# Patient Record
Sex: Male | Born: 1975 | Race: White | Hispanic: No | Marital: Single | Smoking: Current every day smoker
Health system: Southern US, Community
[De-identification: ages and names within clinical notes are randomized; demographics above are authoritative.]

## PROBLEM LIST (undated history)

## (undated) DIAGNOSIS — K219 Gastro-esophageal reflux disease without esophagitis: Secondary | ICD-10-CM

---

## 2010-02-11 ENCOUNTER — Emergency Department: Payer: Self-pay | Admitting: Emergency Medicine

## 2010-02-12 ENCOUNTER — Emergency Department: Payer: Self-pay | Admitting: Emergency Medicine

## 2010-08-16 ENCOUNTER — Emergency Department: Payer: Self-pay | Admitting: Unknown Physician Specialty

## 2011-06-29 ENCOUNTER — Emergency Department: Payer: Self-pay | Admitting: Emergency Medicine

## 2011-10-08 ENCOUNTER — Emergency Department: Payer: Self-pay | Admitting: Emergency Medicine

## 2011-10-14 ENCOUNTER — Emergency Department: Payer: Self-pay | Admitting: Internal Medicine

## 2011-11-19 ENCOUNTER — Emergency Department: Payer: Self-pay | Admitting: Internal Medicine

## 2011-11-23 ENCOUNTER — Emergency Department: Payer: Self-pay | Admitting: Emergency Medicine

## 2011-11-25 ENCOUNTER — Emergency Department: Payer: Self-pay | Admitting: Emergency Medicine

## 2012-07-22 ENCOUNTER — Emergency Department: Payer: Self-pay | Admitting: Unknown Physician Specialty

## 2012-07-22 LAB — CBC
HCT: 45.4 % (ref 40.0–52.0)
HGB: 15.7 g/dL (ref 13.0–18.0)
MCHC: 34.6 g/dL (ref 32.0–36.0)
MCV: 96 fL (ref 80–100)
RDW: 12.9 % (ref 11.5–14.5)

## 2012-07-22 LAB — COMPREHENSIVE METABOLIC PANEL
Albumin: 4.1 g/dL (ref 3.4–5.0)
Alkaline Phosphatase: 100 U/L (ref 50–136)
Anion Gap: 10 (ref 7–16)
BUN: 9 mg/dL (ref 7–18)
Calcium, Total: 9.2 mg/dL (ref 8.5–10.1)
Co2: 26 mmol/L (ref 21–32)
Glucose: 133 mg/dL — ABNORMAL HIGH (ref 65–99)
Osmolality: 278 (ref 275–301)
Potassium: 3.6 mmol/L (ref 3.5–5.1)
SGOT(AST): 21 U/L (ref 15–37)
SGPT (ALT): 28 U/L (ref 12–78)
Sodium: 139 mmol/L (ref 136–145)
Total Protein: 7.9 g/dL (ref 6.4–8.2)

## 2012-07-22 LAB — URINALYSIS, COMPLETE
Bacteria: NONE SEEN
Bilirubin,UR: NEGATIVE
Blood: NEGATIVE
Glucose,UR: NEGATIVE mg/dL (ref 0–75)
Leukocyte Esterase: NEGATIVE
Specific Gravity: 1.031 (ref 1.003–1.030)
Squamous Epithelial: <1
WBC UR: 1 /HPF (ref 0–5)

## 2012-07-22 LAB — DRUG SCREEN, URINE
Amphetamines, Ur Screen: NEGATIVE (ref ?–1000)
Benzodiazepine, Ur Scrn: NEGATIVE (ref ?–200)
Cocaine Metabolite,Ur ~~LOC~~: POSITIVE (ref ?–300)
Methadone, Ur Screen: NEGATIVE (ref ?–300)
Tricyclic, Ur Screen: NEGATIVE (ref ?–1000)

## 2015-01-17 ENCOUNTER — Emergency Department: Payer: Self-pay

## 2015-01-17 ENCOUNTER — Other Ambulatory Visit: Payer: Self-pay

## 2015-01-17 ENCOUNTER — Emergency Department
Admission: EM | Admit: 2015-01-17 | Discharge: 2015-01-17 | Disposition: A | Discharge status: Home / Self Care | Payer: Self-pay | Attending: Emergency Medicine | Admitting: Emergency Medicine

## 2015-01-17 DIAGNOSIS — Z720 Tobacco use: Secondary | ICD-10-CM | POA: Insufficient documentation

## 2015-01-17 DIAGNOSIS — R1013 Epigastric pain: Secondary | ICD-10-CM | POA: Insufficient documentation

## 2015-01-17 DIAGNOSIS — R112 Nausea with vomiting, unspecified: Secondary | ICD-10-CM

## 2015-01-17 LAB — CBC WITH DIFFERENTIAL/PLATELET
BASOS ABS: 0 10*3/uL (ref 0–0.1)
BASOS PCT: 0 %
EOS PCT: 0 %
Eosinophils Absolute: 0 10*3/uL (ref 0–0.7)
HCT: 45 % (ref 40.0–52.0)
Hemoglobin: 15.5 g/dL (ref 13.0–18.0)
Lymphocytes Relative: 13 %
Lymphs Abs: 1.6 10*3/uL (ref 1.0–3.6)
MCH: 31.9 pg (ref 26.0–34.0)
MCHC: 34.5 g/dL (ref 32.0–36.0)
MCV: 92.7 fL (ref 80.0–100.0)
MONOS PCT: 9 %
Monocytes Absolute: 1.1 10*3/uL — ABNORMAL HIGH (ref 0.2–1.0)
NEUTROS ABS: 10.2 10*3/uL — AB (ref 1.4–6.5)
Neutrophils Relative %: 78 %
Platelets: 280 10*3/uL (ref 150–440)
RBC: 4.85 MIL/uL (ref 4.40–5.90)
RDW: 13.2 % (ref 11.5–14.5)
WBC: 13 10*3/uL — ABNORMAL HIGH (ref 3.8–10.6)

## 2015-01-17 LAB — URINALYSIS COMPLETE WITH MICROSCOPIC (ARMC ONLY)
BILIRUBIN URINE: NEGATIVE
GLUCOSE, UA: NEGATIVE mg/dL
Hgb urine dipstick: NEGATIVE
Leukocytes, UA: NEGATIVE
Nitrite: NEGATIVE
Protein, ur: 30 mg/dL — AB
SQUAMOUS EPITHELIAL / LPF: NONE SEEN
Specific Gravity, Urine: >1.06 — ABNORMAL HIGH (ref 1.005–1.030)
pH: 6 (ref 5.0–8.0)

## 2015-01-17 LAB — COMPREHENSIVE METABOLIC PANEL
ALT: 28 U/L (ref 17–63)
AST: 27 U/L (ref 15–41)
Albumin: 5.1 g/dL — ABNORMAL HIGH (ref 3.5–5.0)
Alkaline Phosphatase: 72 U/L (ref 38–126)
Anion gap: 13 (ref 5–15)
BUN: 15 mg/dL (ref 6–20)
CO2: 28 mmol/L (ref 22–32)
Calcium: 9.9 mg/dL (ref 8.9–10.3)
Chloride: 100 mmol/L — ABNORMAL LOW (ref 101–111)
Creatinine, Ser: 1.08 mg/dL (ref 0.61–1.24)
GFR calc Af Amer: >60 mL/min (ref >60)
Glucose, Bld: 152 mg/dL — ABNORMAL HIGH (ref 65–99)
Potassium: 3.6 mmol/L (ref 3.5–5.1)
SODIUM: 141 mmol/L (ref 135–145)
TOTAL PROTEIN: 8.3 g/dL — AB (ref 6.5–8.1)
Total Bilirubin: 0.9 mg/dL (ref 0.3–1.2)

## 2015-01-17 LAB — TROPONIN I: Troponin I: <0.03 ng/mL (ref <0.031)

## 2015-01-17 LAB — LIPASE, BLOOD: Lipase: 21 U/L — ABNORMAL LOW (ref 22–51)

## 2015-01-17 MED ORDER — ALUM & MAG HYDROXIDE-SIMETH 200-200-20 MG/5ML PO SUSP
30.0000 mL | ORAL | Status: AC
  Administered 2015-01-17: 30 mL via ORAL

## 2015-01-17 MED ORDER — HYDROMORPHONE HCL 1 MG/ML IJ SOLN
1.0000 mg | INTRAMUSCULAR | Status: AC
  Administered 2015-01-17: 1 mg via INTRAVENOUS

## 2015-01-17 MED ORDER — SODIUM CHLORIDE 0.9 % IV BOLUS (SEPSIS)
1000.0000 mL | Freq: Once | INTRAVENOUS | Status: AC
  Administered 2015-01-17: 1000 mL via INTRAVENOUS

## 2015-01-17 MED ORDER — MORPHINE SULFATE 4 MG/ML IJ SOLN
INTRAMUSCULAR | Status: AC
  Filled 2015-01-17: qty 1

## 2015-01-17 MED ORDER — KETOROLAC TROMETHAMINE 30 MG/ML IJ SOLN
30.0000 mg | Freq: Once | INTRAMUSCULAR | Status: AC
  Administered 2015-01-17: 30 mg via INTRAVENOUS

## 2015-01-17 MED ORDER — ONDANSETRON HCL 4 MG/2ML IJ SOLN
4.0000 mg | Freq: Once | INTRAMUSCULAR | Status: AC
  Administered 2015-01-17: 4 mg via INTRAVENOUS

## 2015-01-17 MED ORDER — HYDROMORPHONE HCL 1 MG/ML IJ SOLN
INTRAMUSCULAR | Status: AC
  Filled 2015-01-17: qty 1

## 2015-01-17 MED ORDER — ONDANSETRON HCL 4 MG/2ML IJ SOLN
INTRAMUSCULAR | Status: DC
Start: 2015-01-17 — End: 2015-01-17
  Filled 2015-01-17: qty 2

## 2015-01-17 MED ORDER — PROMETHAZINE HCL 25 MG/ML IJ SOLN
25.0000 mg | Freq: Once | INTRAMUSCULAR | Status: AC
  Administered 2015-01-17: 25 mg via INTRAVENOUS

## 2015-01-17 MED ORDER — SODIUM CHLORIDE 0.9 % IV BOLUS (SEPSIS): 1000.0000 mL | INTRAVENOUS | Status: DC

## 2015-01-17 MED ORDER — PROMETHAZINE HCL 50 MG PO TABS
25.0000 mg | ORAL_TABLET | Freq: Four times a day (QID) | ORAL | Status: DC | PRN
Start: 2015-01-17 — End: 2015-02-27

## 2015-01-17 MED ORDER — KETOROLAC TROMETHAMINE 30 MG/ML IJ SOLN
INTRAMUSCULAR | Status: AC
  Filled 2015-01-17: qty 1

## 2015-01-17 MED ORDER — PROMETHAZINE HCL 25 MG/ML IJ SOLN
INTRAMUSCULAR | Status: AC
  Filled 2015-01-17: qty 1

## 2015-01-17 MED ORDER — ALUM & MAG HYDROXIDE-SIMETH 200-200-20 MG/5ML PO SUSP
ORAL | Status: AC
  Filled 2015-01-17: qty 30

## 2015-01-17 MED ORDER — IOHEXOL 300 MG/ML  SOLN
100.0000 mL | Freq: Once | INTRAMUSCULAR | Status: AC | PRN
Start: 2015-01-17 — End: 2015-01-17
  Administered 2015-01-17: 100 mL via INTRAVENOUS

## 2015-01-17 MED ORDER — MORPHINE SULFATE 4 MG/ML IJ SOLN
4.0000 mg | Freq: Once | INTRAMUSCULAR | Status: AC
Start: 2015-01-17 — End: 2015-01-17
  Administered 2015-01-17: 4 mg via INTRAVENOUS

## 2015-01-17 NOTE — Discharge Instructions (Signed)
Nausea and Vomiting You have been seen in the Emergency Department (ED) for abdominal pain.  Your evaluation did not identify a clear cause of your symptoms but was generally reassuring.  Please follow up as instructed above regarding todays emergent visit and the symptoms that are bothering you.  Return to the ED if your abdominal pain worsens or fails to improve, you develop bloody vomiting, bloody diarrhea, you are unable to tolerate fluids due to vomiting, fever greater than 101, or other symptoms that concern you.   Nausea is a sick feeling that often comes before throwing up (vomiting). Vomiting is a reflex where stomach contents come out of your mouth. Vomiting can cause severe loss of body fluids (dehydration). Children and elderly adults can become dehydrated quickly, especially if they also have diarrhea. Nausea and vomiting are symptoms of a condition or disease. It is important to find the cause of your symptoms. CAUSES   Direct irritation of the stomach lining. This irritation can result from increased acid production (gastroesophageal reflux disease), infection, food poisoning, taking certain medicines (such as nonsteroidal anti-inflammatory drugs), alcohol use, or tobacco use.  Signals from the brain.These signals could be caused by a headache, heat exposure, an inner ear disturbance, increased pressure in the brain from injury, infection, a tumor, or a concussion, pain, emotional stimulus, or metabolic problems.  An obstruction in the gastrointestinal tract (bowel obstruction).  Illnesses such as diabetes, hepatitis, gallbladder problems, appendicitis, kidney problems, cancer, sepsis, atypical symptoms of a heart attack, or eating disorders.  Medical treatments such as chemotherapy and radiation.  Receiving medicine that makes you sleep (general anesthetic) during surgery. DIAGNOSIS Your caregiver may ask for tests to be done if the problems do not improve after a few days.  Tests may also be done if symptoms are severe or if the reason for the nausea and vomiting is not clear. Tests may include:  Urine tests.  Blood tests.  Stool tests.  Cultures (to look for evidence of infection).  X-rays or other imaging studies. Test results can help your caregiver make decisions about treatment or the need for additional tests. TREATMENT You need to stay well hydrated. Drink frequently but in small amounts.You may wish to drink water, sports drinks, clear broth, or eat frozen ice pops or gelatin dessert to help stay hydrated.When you eat, eating slowly may help prevent nausea.There are also some antinausea medicines that may help prevent nausea. HOME CARE INSTRUCTIONS   Take all medicine as directed by your caregiver.  If you do not have an appetite, do not force yourself to eat. However, you must continue to drink fluids.  If you have an appetite, eat a normal diet unless your caregiver tells you differently.  Eat a variety of complex carbohydrates (rice, wheat, potatoes, bread), lean meats, yogurt, fruits, and vegetables.  Avoid high-fat foods because they are more difficult to digest.  Drink enough water and fluids to keep your urine clear or pale yellow.  If you are dehydrated, ask your caregiver for specific rehydration instructions. Signs of dehydration may include:  Severe thirst.  Dry lips and mouth.  Dizziness.  Dark urine.  Decreasing urine frequency and amount.  Confusion.  Rapid breathing or pulse. SEEK IMMEDIATE MEDICAL CARE IF:   You have blood or brown flecks (like coffee grounds) in your vomit.  You have black or bloody stools.  You have a severe headache or stiff neck.  You are confused.  You have severe abdominal pain.  You have chest  pain or trouble breathing.  You do not urinate at least once every 8 hours.  You develop cold or clammy skin.  You continue to vomit for longer than 24 to 48 hours.  You have a  fever. MAKE SURE YOU:   Understand these instructions.  Will watch your condition.  Will get help right away if you are not doing well or get worse. Document Released: 08/30/2005 Document Revised: 11/22/2011 Document Reviewed: 01/27/2011 [facility] Patient Information 2015 [employer], [place]. This information is not intended to replace advice given to you by your health care provider. Make sure you discuss any questions you have with your health care provider.

## 2015-01-17 NOTE — ED Notes (Signed)
Pt informed to return if any life threatening symptoms occur.  

## 2015-01-17 NOTE — ED Notes (Signed)
Pt presents to ED with c/o severe abdominal pain x2-3 days, with forceful; N/V. Denies shortness of breath, no chest pain. Pt reports "just throwing up and stomach pain". Pt is A&O, in moderate distress, actively writhing and calling out in pain.

## 2015-01-17 NOTE — ED Provider Notes (Addendum)
[facility] Emergency Department Provider Note    ____________________________________________  Time seen: 4:25 AM  I have reviewed the triage vital signs and the nursing notes.   HISTORY  Chief Complaint Abdominal Pain   HPI Blake Kerr is a 39 y.o. male presents with severe abdominal pain that started approximately 2 days ago. Patient reports that he's been having upset stomach for approximately the last 24 hours, got a little better before he went to bed at 9:00, then at 2 AM awoke with severe worsening of his upper abdominal pain. He says it hurts around his belly button. This is associated with vomiting, nausea, and retching.  Location is upper abdominal and around bellybutton. Duration 2 days. Timing gradual onset, with sudden worsening tonight. Severity is rated severe and 10 out of 10. Quality is sharp and painful. No recent surgeries noted medications no changes in activities. No heavy drinking. Nothing makes it better.  Patient denies diarrhea, denies bloody vomitus, denies fevers chills. Patient denies any chest pain or shortness of breath.  Patient states he has no medical history, no medical problems, he does use alcohol approximately 24 ounces per day, but is not a alcohol the last 3 days.  Patient denies medical history  Patient denies any allergies to any medications  Patient is a smoker  Patient does use alcohol occasionally, none the last 3 days.  History reviewed. No pertinent past medical history.  There are no active problems to display for this patient.   History reviewed. No pertinent past surgical history.  No current outpatient prescriptions on file.  Allergies Review of patient's allergies indicates no known allergies.  History reviewed. No pertinent family history.  Social History History  Substance Use Topics  . Smoking status: Current Every Day Smoker -- 0.75 packs/day  . Smokeless tobacco: Not on file  .  Alcohol Use: 8.4 oz/week    14 Cans of beer per week    Review of Systems  Constitutional: Negative for fever. Eyes: Negative for visual changes. ENT: Negative for sore throat. Cardiovascular: Negative for chest pain. Respiratory: Negative for shortness of breath. Gastrointestinal: See history of present illness Genitourinary: Negative for dysuria. Musculoskeletal: Negative for back pain. Skin: Negative for rash. Neurological: Negative for headaches, focal weakness or numbness. No testicular pain no groin pain.  10-point ROS otherwise negative.  ____________________________________________   PHYSICAL EXAM:  VITAL SIGNS: ED Triage Vitals  Enc Vitals Group     BP --      Pulse --      Resp --      Temp --      Temp src --      SpO2 01/17/15 0426 100 %     Weight --      Height --      Head Cir --      Peak Flow --      Pain Score --      Pain Loc --      Pain Edu? --      Excl. in GC? --      Constitutional: Alert and oriented. Patient initially laying on his stomach, dry heaving. Appears very uncomfortable patient and is fully awake and severe pain apparent. Eyes: Conjunctivae are normal. PERRL. Normal extraocular movements. ENT   Head: Normocephalic and atraumatic.   Nose: No congestion/rhinnorhea.   Mouth/Throat: Mucous membranes are moist.   Neck: No stridor. Hematological/Lymphatic/Immunilogical: No cervical lymphadenopathy. Cardiovascular: Normal rate, regular rhythm. Normal and symmetric distal pulses  are present in all extremities. No murmurs, rubs, or gallops. Respiratory: Normal respiratory effort without tachypnea nor retractions. Breath sounds are clear and equal bilaterally. No wheezes/rales/rhonchi. Gastrointestinal: Patient with significant tenderness in the epigastrium and periumbilical region. There is no distention. There is no rebound or guarding noted. Genitourinary: Normal testicles and penis, and no groin mass or  lesion. Musculoskeletal: Nontender with normal range of motion in all extremities. No joint effusions.  No lower extremity tenderness nor edema. Neurologic:  Normal speech and language. No gross focal neurologic deficits are appreciated. Speech is normal. No gait instability. Skin:  Skin is warm, dry and intact. No rash noted. Psychiatric: Mood and affect are normal. Speech and behavior are normal. Patient exhibits appropriate insight and judgment.  ____________________________________________    LABS (pertinent positives/negatives)  Results for orders placed or performed during the hospital encounter of 01/17/15  CBC WITH DIFFERENTIAL  Result Value Ref Range   WBC 13.0 (H) 3.8 - 10.6 K/uL   RBC 4.85 4.40 - 5.90 MIL/uL   Hemoglobin 15.5 13.0 - 18.0 g/dL   HCT 16.145.0 09.640.0 - 04.552.0 %   MCV 92.7 80.0 - 100.0 fL   MCH 31.9 26.0 - 34.0 pg   MCHC 34.5 32.0 - 36.0 g/dL   RDW 40.913.2 81.111.5 - 91.414.5 %   Platelets 280 150 - 440 K/uL   Neutrophils Relative % 78 %   Neutro Abs 10.2 (H) 1.4 - 6.5 K/uL   Lymphocytes Relative 13 %   Lymphs Abs 1.6 1.0 - 3.6 K/uL   Monocytes Relative 9 %   Monocytes Absolute 1.1 (H) 0.2 - 1.0 K/uL   Eosinophils Relative 0 %   Eosinophils Absolute 0.0 0 - 0.7 K/uL   Basophils Relative 0 %   Basophils Absolute 0.0 0 - 0.1 K/uL  Comprehensive metabolic panel  Result Value Ref Range   Sodium 141 135 - 145 mmol/L   Potassium 3.6 3.5 - 5.1 mmol/L   Chloride 100 (L) 101 - 111 mmol/L   CO2 28 22 - 32 mmol/L   Glucose, Bld 152 (H) 65 - 99 mg/dL   BUN 15 6 - 20 mg/dL   Creatinine, Ser 7.821.08 0.61 - 1.24 mg/dL   Calcium 9.9 8.9 - 95.610.3 mg/dL   Total Protein 8.3 (H) 6.5 - 8.1 g/dL   Albumin 5.1 (H) 3.5 - 5.0 g/dL   AST 27 15 - 41 U/L   ALT 28 17 - 63 U/L   Alkaline Phosphatase 72 38 - 126 U/L   Total Bilirubin 0.9 0.3 - 1.2 mg/dL   GFR calc non Af Amer >60 >60 mL/min   GFR calc Af Amer >60 >60 mL/min   Anion gap 13 5 - 15  Lipase, blood  Result Value Ref Range   Lipase  21 (L) 22 - 51 U/L  Troponin I  (only if pt is 37 y.o. or older and pain is above umbilicus)  Result Value Ref Range   Troponin I <0.03 <0.031 ng/mL     ____________________________________________   EKG  Normal sinus rhythm with sinus arrhythmia rate 82 bpm right axis with incomplete right bundle-branch block. There are no acute ischemic ST changes. QRS duration 96 QTc calculated at 444.  ____________________________________________    RADIOLOGY  CT abdomen and pelvis: No acute  ____________________________________________   PROCEDURES  Procedure(s) performed: None  Critical Care performed: No  ____________________________________________   INITIAL IMPRESSION / ASSESSMENT AND PLAN / ED COURSE  Pertinent labs & imaging results that were  available during my care of the patient were reviewed by me and considered in my medical decision making (see chart for details).  Patient with no significant past medical history presents with approximately 2 days of abdominal pain nausea, with severe worsening this evening. Patient has severe epigastric pain. Patient appears very uncomfortable.  Differential diagnosis includes perforated ulcer, acute pancreatitis, cholecystitis, choledocholithiasis, acute hepatitis, and other abdominal pathologies.  Patient denies any cardiopulmonary symptoms.  At this point will send a abdominal lab panel, obtain an upright chest x-ray to evaluate for any free air, obtain CT imaging of the abdomen and pelvis given the severity of symptoms.  Pain control with morphine, Toradol, Zofran, hydrate.    ----------------------------------------- 5:57 AM on 01/17/2015 -----------------------------------------  Reevaluated patient. He is currently resting much more comfortably, his nausea is improving. He still reports that he is having moderate mid abdominal pain at this time. His parents are now present, and I have updated them on plan of care. Pending  CT abdomen and pelvis at this time.   ----------------------------------------- 6:49 AM on 01/17/2015 -----------------------------------------  Patient reports he feels much improved now. His CT scan has been performed and is notably normal at this time. Patient reports his abdominal pain and nausea are now much improved. He is resting comfortably. He is able tolerate by mouth contrast.  Review of labs notable for a slight leukocytosis, otherwise normal LFTs and lipase. His ECG shows no ischemic changes. His clinical course appears much improved at this time.  Given the amount of nausea and vomiting had we will continue with an additional 1 L of normal saline.  Discussed with the patient return precautions specifically for abdominal pain.  Plan of care is to administer additional 1 L of normal saline, we'll have by Dr. York CeriseForbach partner follow-up on patient's UA and reeval prior to discharge to assure he remains improved.  Patient does not have a primary care physician, but I did notify him he can follow-up at the current total acute clinic or the emergency department in 1-2 days for repeat evaluation.  Turned precautions specifically in including fever, recurrence of severe pain, vomiting and inability to hold by mouth down, weakness, or other concerns discussed with the patient.  Patient continues to feel well at this time. Discussed with him that we will give him an additional liter of fluid because of the severity of his symptoms, he is quite agreeable to this. Patient agrees not to drive home. He arrived by EMS, and his parents will be taking him home.    FINAL CLINICAL IMPRESSION(S) / ED DIAGNOSES  Final diagnoses:  Non-intractable vomiting with nausea, vomiting of unspecified type     Sharyn CreamerMark Annye Forrey, MD 01/17/15 [id number]  Sharyn CreamerMark Kaushik Maul, MD 01/17/15 [phone]

## 2015-01-17 NOTE — ED Provider Notes (Signed)
-----------------------------------------   9:37 AM on 01/17/2015 -----------------------------------------  The patient has completed another liter of fluids. He is in no acute distress and his pain is under control. His urinalysis was negative. He will be discharged per Dr. Lorenza ChickQuale's plan.  Loleta Roseory Jarell Mcewen, MD 01/17/15 [phone]

## 2015-01-18 DIAGNOSIS — F1010 Alcohol abuse, uncomplicated: Secondary | ICD-10-CM | POA: Insufficient documentation

## 2015-01-18 DIAGNOSIS — F1210 Cannabis abuse, uncomplicated: Secondary | ICD-10-CM | POA: Insufficient documentation

## 2015-01-18 DIAGNOSIS — R109 Unspecified abdominal pain: Secondary | ICD-10-CM | POA: Insufficient documentation

## 2015-02-27 ENCOUNTER — Emergency Department
Admission: EM | Admit: 2015-02-27 | Discharge: 2015-02-27 | Disposition: A | Discharge status: Home / Self Care | Payer: Self-pay | Attending: Emergency Medicine | Admitting: Emergency Medicine

## 2015-02-27 ENCOUNTER — Emergency Department: Payer: Self-pay

## 2015-02-27 ENCOUNTER — Encounter: Payer: Self-pay | Admitting: Emergency Medicine

## 2015-02-27 DIAGNOSIS — F419 Anxiety disorder, unspecified: Secondary | ICD-10-CM | POA: Insufficient documentation

## 2015-02-27 DIAGNOSIS — R109 Unspecified abdominal pain: Secondary | ICD-10-CM

## 2015-02-27 DIAGNOSIS — K2970 Gastritis, unspecified, without bleeding: Secondary | ICD-10-CM | POA: Insufficient documentation

## 2015-02-27 DIAGNOSIS — Z720 Tobacco use: Secondary | ICD-10-CM | POA: Insufficient documentation

## 2015-02-27 LAB — CBC
HCT: 45.7 % (ref 40.0–52.0)
HEMOGLOBIN: 15.4 g/dL (ref 13.0–18.0)
MCH: 31.7 pg (ref 26.0–34.0)
MCHC: 33.8 g/dL (ref 32.0–36.0)
MCV: 93.8 fL (ref 80.0–100.0)
Platelets: 247 10*3/uL (ref 150–440)
RBC: 4.87 MIL/uL (ref 4.40–5.90)
RDW: 13.1 % (ref 11.5–14.5)
WBC: 14.9 10*3/uL — ABNORMAL HIGH (ref 3.8–10.6)

## 2015-02-27 LAB — COMPREHENSIVE METABOLIC PANEL
ALK PHOS: 79 U/L (ref 38–126)
ALT: 32 U/L (ref 17–63)
AST: 34 U/L (ref 15–41)
Albumin: 4.8 g/dL (ref 3.5–5.0)
Anion gap: 14 (ref 5–15)
BUN: 13 mg/dL (ref 6–20)
CALCIUM: 9.6 mg/dL (ref 8.9–10.3)
CO2: 25 mmol/L (ref 22–32)
Chloride: 100 mmol/L — ABNORMAL LOW (ref 101–111)
Creatinine, Ser: 0.86 mg/dL (ref 0.61–1.24)
GFR calc Af Amer: >60 mL/min (ref >60)
GFR calc non Af Amer: >60 mL/min (ref >60)
GLUCOSE: 162 mg/dL — AB (ref 65–99)
POTASSIUM: 3.6 mmol/L (ref 3.5–5.1)
SODIUM: 139 mmol/L (ref 135–145)
TOTAL PROTEIN: 8 g/dL (ref 6.5–8.1)
Total Bilirubin: 0.8 mg/dL (ref 0.3–1.2)

## 2015-02-27 LAB — LIPASE, BLOOD: LIPASE: 27 U/L (ref 22–51)

## 2015-02-27 MED ORDER — MORPHINE SULFATE 4 MG/ML IJ SOLN
INTRAMUSCULAR | Status: AC
  Administered 2015-02-27: 4 mg via INTRAVENOUS
  Filled 2015-02-27: qty 1

## 2015-02-27 MED ORDER — MORPHINE SULFATE 4 MG/ML IJ SOLN
4.0000 mg | Freq: Once | INTRAMUSCULAR | Status: AC
Start: 2015-02-27 — End: 2015-02-27
  Administered 2015-02-27: 4 mg via INTRAVENOUS

## 2015-02-27 MED ORDER — SODIUM CHLORIDE 0.9 % IV SOLN
1000.0000 mL | Freq: Once | INTRAVENOUS | Status: AC
  Administered 2015-02-27: 1000 mL via INTRAVENOUS

## 2015-02-27 MED ORDER — ONDANSETRON HCL 4 MG PO TABS: 4.0000 mg | ORAL_TABLET | Freq: Every day | ORAL | Status: DC | PRN

## 2015-02-27 MED ORDER — ONDANSETRON HCL 4 MG/2ML IJ SOLN
INTRAMUSCULAR | Status: AC
  Administered 2015-02-27: 4 mg via INTRAVENOUS
  Filled 2015-02-27: qty 2

## 2015-02-27 MED ORDER — ONDANSETRON HCL 4 MG/2ML IJ SOLN
4.0000 mg | Freq: Once | INTRAMUSCULAR | Status: AC
  Administered 2015-02-27: 4 mg via INTRAVENOUS

## 2015-02-27 NOTE — Discharge Instructions (Signed)

## 2015-02-27 NOTE — ED Notes (Signed)
Pt moaning loudly in triage, states his abd pain and cramping began yesterday also with vomiting, states he has been seen here recently for the same.

## 2015-02-27 NOTE — ED Provider Notes (Signed)
[facility] Emergency Department Provider Note  ____________________________________________  Time seen: On arrival  I have reviewed the triage vital signs and the nursing notes.   HISTORY  Chief Complaint Abdominal Pain      HPI Blake Kerr is a 39 y.o. male who presents with epigastric pain which started yesterday at approximately 2 PM. He reports nausea vomiting since that time. He denies hematemesis. He has had this before. He reports the pain is severe and cramping. He denies diarrhea. No chest pain. He was seen in the emergency department approximately one month ago for similar complaints.Review of records demonstrates that he has also been seen at [facility] for similar complaints     History reviewed. No pertinent past medical history.  There are no active problems to display for this patient.   History reviewed. No pertinent past surgical history.  Current Outpatient Rx  Name  Route  Sig  Dispense  Refill  . promethazine (PHENERGAN) 50 MG tablet   Oral   Take 0.5 tablets (25 mg total) by mouth every 6 (six) hours as needed for nausea or vomiting.   20 tablet   0     Allergies Review of patient's allergies indicates no known allergies.  No family history on file.  Social History History  Substance Use Topics  . Smoking status: Current Every Day Smoker -- 0.50 packs/day    Types: Cigarettes  . Smokeless tobacco: Not on file  . Alcohol Use: 8.4 oz/week    14 Cans of beer per week    Review of Systems  Constitutional: Negative for fever. Eyes: Negative for visual changes. ENT: Negative for sore throat Cardiovascular: Negative for chest pain. Respiratory: Negative for shortness of breath. Gastrointestinal: Positive for abdominal pain and nausea vomiting Genitourinary: Negative for dysuria. Musculoskeletal: Negative for back pain. Skin: Negative for rash. Neurological: Negative for headaches or focal weakness Psychiatric:  Anxiety  10-point ROS otherwise negative.  ____________________________________________   PHYSICAL EXAM:  VITAL SIGNS: ED Triage Vitals  Enc Vitals Group     BP 02/27/15 0742 96/81 mmHg     Pulse Rate 02/27/15 0742 75     Resp 02/27/15 0742 20     Temp 02/27/15 0742 98.2 F (36.8 C)     Temp Source 02/27/15 0742 Oral     SpO2 02/27/15 0742 98 %     Weight 02/27/15 0742 190 lb (86.183 kg)     Height 02/27/15 0742  (1.854 m)     Head Cir --      Peak Flow --      Pain Score 02/27/15 0748 10     Pain Loc --      Pain Edu? --      Excl. in GC? --      Constitutional: Alert and oriented. Lying prone and moaning Eyes: Conjunctivae are normal. PERRL. ENT   Head: Normocephalic and atraumatic.   Nose: No rhinnorhea.   Mouth/Throat: Mucous membranes are moist. Cardiovascular: Normal rate, regular rhythm. Normal and symmetric distal pulses are present in all extremities. No murmurs, rubs, or gallops. Respiratory: Normal respiratory effort without tachypnea nor retractions. Breath sounds are clear and equal bilaterally.  Gastrointestinal: Mild tenderness to palpation of the epigastrium. No peritonitis .No distention. There is no CVA tenderness. Genitourinary: deferred Musculoskeletal: Nontender with normal range of motion in all extremities. No lower extremity tenderness nor edema. Neurologic:  Normal speech and language. No gross focal neurologic deficits are appreciated. Skin:  Skin is  warm, dry and intact. No rash noted. Psychiatric: Patient is anxious and difficult to get full history from  ____________________________________________    LABS (pertinent positives/negatives)  Labs Reviewed  CBC  COMPREHENSIVE METABOLIC PANEL  LIPASE, BLOOD    ____________________________________________   EKG  None  ____________________________________________    RADIOLOGY  None  ____________________________________________   PROCEDURES  Procedure(s)  performed: none  Critical Care performed: none  ____________________________________________   INITIAL IMPRESSION / ASSESSMENT AND PLAN / ED COURSE  Pertinent labs & imaging results that were available during my care of the patient were reviewed by me and considered in my medical decision making (see chart for details).  We will start an IV, give morphine and Zofran and normal saline bolus while we wait for blood work. Review of records demonstrates recent presentation at [facility] where he was diagnosed with cannabis hyperemesis syndrome and counseled on marijuana cessation. He tells me that he is not using as much marijuana as he was before although his sister appears to disagree ----------------------------------------- 10:38 AM on 02/27/2015 -----------------------------------------  Patient pain-free after treatment. No nausea no vomiting. He says that he is ready to go home. Although he has a mildly elevated white blood cell count I suspect that was related to the vomiting. Clinically patient looks well, vital signs stable. Return precautions given ____________________________________________   FINAL CLINICAL IMPRESSION(S) / ED DIAGNOSES  Final diagnoses:  Abdominal pain  Gastritis     Jene Every, MD 02/27/15 1038

## 2015-02-27 NOTE — ED Notes (Signed)
Patient transported to X-ray (X-ray performed at bedside)

## 2015-02-28 ENCOUNTER — Emergency Department: Payer: Self-pay

## 2015-02-28 ENCOUNTER — Observation Stay
Admission: EM | Admit: 2015-02-28 | Discharge: 2015-03-02 | Disposition: A | Discharge status: Home / Self Care | Payer: Self-pay | Attending: Internal Medicine | Admitting: Internal Medicine

## 2015-02-28 DIAGNOSIS — Z6825 Body mass index (BMI) 25.0-25.9, adult: Secondary | ICD-10-CM | POA: Insufficient documentation

## 2015-02-28 DIAGNOSIS — K920 Hematemesis: Secondary | ICD-10-CM | POA: Insufficient documentation

## 2015-02-28 DIAGNOSIS — R197 Diarrhea, unspecified: Secondary | ICD-10-CM

## 2015-02-28 DIAGNOSIS — E669 Obesity, unspecified: Secondary | ICD-10-CM | POA: Insufficient documentation

## 2015-02-28 DIAGNOSIS — K210 Gastro-esophageal reflux disease with esophagitis: Secondary | ICD-10-CM | POA: Insufficient documentation

## 2015-02-28 DIAGNOSIS — F1290 Cannabis use, unspecified, uncomplicated: Secondary | ICD-10-CM | POA: Insufficient documentation

## 2015-02-28 DIAGNOSIS — R61 Generalized hyperhidrosis: Secondary | ICD-10-CM | POA: Insufficient documentation

## 2015-02-28 DIAGNOSIS — F17210 Nicotine dependence, cigarettes, uncomplicated: Secondary | ICD-10-CM | POA: Insufficient documentation

## 2015-02-28 DIAGNOSIS — Z79899 Other long term (current) drug therapy: Secondary | ICD-10-CM | POA: Insufficient documentation

## 2015-02-28 DIAGNOSIS — R112 Nausea with vomiting, unspecified: Secondary | ICD-10-CM | POA: Diagnosis present

## 2015-02-28 DIAGNOSIS — D72829 Elevated white blood cell count, unspecified: Secondary | ICD-10-CM | POA: Insufficient documentation

## 2015-02-28 DIAGNOSIS — Z8489 Family history of other specified conditions: Secondary | ICD-10-CM | POA: Insufficient documentation

## 2015-02-28 DIAGNOSIS — Z8249 Family history of ischemic heart disease and other diseases of the circulatory system: Secondary | ICD-10-CM | POA: Insufficient documentation

## 2015-02-28 DIAGNOSIS — R1110 Vomiting, unspecified: Secondary | ICD-10-CM

## 2015-02-28 DIAGNOSIS — K2210 Ulcer of esophagus without bleeding: Principal | ICD-10-CM | POA: Insufficient documentation

## 2015-02-28 LAB — URINALYSIS COMPLETE WITH MICROSCOPIC (ARMC ONLY)
BACTERIA UA: NONE SEEN
BILIRUBIN URINE: NEGATIVE
Glucose, UA: NEGATIVE mg/dL
Hgb urine dipstick: NEGATIVE
Leukocytes, UA: NEGATIVE
NITRITE: NEGATIVE
PH: 7 (ref 5.0–8.0)
PROTEIN: 30 mg/dL — AB
Specific Gravity, Urine: >1.06 — ABNORMAL HIGH (ref 1.005–1.030)
Squamous Epithelial / LPF: NONE SEEN

## 2015-02-28 LAB — COMPREHENSIVE METABOLIC PANEL
ALT: 33 U/L (ref 17–63)
ANION GAP: 13 (ref 5–15)
AST: 33 U/L (ref 15–41)
Albumin: 5.1 g/dL — ABNORMAL HIGH (ref 3.5–5.0)
Alkaline Phosphatase: 68 U/L (ref 38–126)
BILIRUBIN TOTAL: 0.6 mg/dL (ref 0.3–1.2)
BUN: 13 mg/dL (ref 6–20)
CALCIUM: 9.9 mg/dL (ref 8.9–10.3)
CO2: 26 mmol/L (ref 22–32)
CREATININE: 0.94 mg/dL (ref 0.61–1.24)
Chloride: 102 mmol/L (ref 101–111)
Glucose, Bld: 149 mg/dL — ABNORMAL HIGH (ref 65–99)
Potassium: 3.5 mmol/L (ref 3.5–5.1)
Sodium: 141 mmol/L (ref 135–145)
Total Protein: 8.2 g/dL — ABNORMAL HIGH (ref 6.5–8.1)

## 2015-02-28 LAB — URINE DRUG SCREEN, QUALITATIVE (ARMC ONLY)
Amphetamines, Ur Screen: NOT DETECTED
Barbiturates, Ur Screen: NOT DETECTED
Benzodiazepine, Ur Scrn: POSITIVE — AB
COCAINE METABOLITE, UR ~~LOC~~: NOT DETECTED
Cannabinoid 50 Ng, Ur ~~LOC~~: POSITIVE — AB
MDMA (Ecstasy)Ur Screen: NOT DETECTED
Methadone Scn, Ur: NOT DETECTED
OPIATE, UR SCREEN: POSITIVE — AB
Phencyclidine (PCP) Ur S: NOT DETECTED
TRICYCLIC, UR SCREEN: NOT DETECTED

## 2015-02-28 LAB — CBC
HCT: 49.5 % (ref 40.0–52.0)
Hemoglobin: 16.7 g/dL (ref 13.0–18.0)
MCH: 32 pg (ref 26.0–34.0)
MCHC: 33.8 g/dL (ref 32.0–36.0)
MCV: 94.8 fL (ref 80.0–100.0)
PLATELETS: 260 10*3/uL (ref 150–440)
RBC: 5.22 MIL/uL (ref 4.40–5.90)
RDW: 13 % (ref 11.5–14.5)
WBC: 10.8 10*3/uL — ABNORMAL HIGH (ref 3.8–10.6)

## 2015-02-28 LAB — LIPASE, BLOOD: Lipase: 27 U/L (ref 22–51)

## 2015-02-28 LAB — C DIFFICILE QUICK SCREEN W PCR REFLEX
C DIFFICLE (CDIFF) ANTIGEN: NEGATIVE
C Diff interpretation: NEGATIVE
C Diff toxin: NEGATIVE

## 2015-02-28 MED ORDER — PROMETHAZINE HCL 25 MG/ML IJ SOLN
25.0000 mg | Freq: Once | INTRAMUSCULAR | Status: AC
  Administered 2015-02-28: 25 mg via INTRAVENOUS

## 2015-02-28 MED ORDER — POTASSIUM CHLORIDE IN NACL 20-0.9 MEQ/L-% IV SOLN
INTRAVENOUS | Status: DC
  Administered 2015-02-28 – 2015-03-01 (×3): via INTRAVENOUS
  Filled 2015-02-28 (×11): qty 1000

## 2015-02-28 MED ORDER — IOHEXOL 300 MG/ML  SOLN
100.0000 mL | Freq: Once | INTRAMUSCULAR | Status: AC | PRN
  Administered 2015-02-28: 100 mL via INTRAVENOUS

## 2015-02-28 MED ORDER — MORPHINE SULFATE 2 MG/ML IJ SOLN
2.0000 mg | INTRAMUSCULAR | Status: DC | PRN
Start: 2015-02-28 — End: 2015-03-02
  Administered 2015-02-28 – 2015-03-02 (×5): 2 mg via INTRAVENOUS
  Filled 2015-02-28 (×6): qty 1

## 2015-02-28 MED ORDER — ONDANSETRON HCL 4 MG/2ML IJ SOLN
INTRAMUSCULAR | Status: AC
  Administered 2015-02-28: 4 mg via INTRAVENOUS
  Filled 2015-02-28: qty 2

## 2015-02-28 MED ORDER — ONDANSETRON HCL 4 MG/2ML IJ SOLN
4.0000 mg | Freq: Once | INTRAMUSCULAR | Status: AC
  Administered 2015-02-28: 4 mg via INTRAVENOUS

## 2015-02-28 MED ORDER — PROMETHAZINE HCL 25 MG/ML IJ SOLN
INTRAMUSCULAR | Status: AC
  Administered 2015-02-28: 25 mg via INTRAVENOUS
  Filled 2015-02-28: qty 1

## 2015-02-28 MED ORDER — LORAZEPAM 2 MG/ML IJ SOLN
1.0000 mg | Freq: Once | INTRAMUSCULAR | Status: AC
  Administered 2015-02-28: 1 mg via INTRAVENOUS

## 2015-02-28 MED ORDER — PROMETHAZINE HCL 25 MG/ML IJ SOLN
12.5000 mg | Freq: Four times a day (QID) | INTRAMUSCULAR | Status: DC | PRN
  Administered 2015-02-28: 12.5 mg via INTRAMUSCULAR
  Filled 2015-02-28: qty 1

## 2015-02-28 MED ORDER — MORPHINE SULFATE 4 MG/ML IJ SOLN
4.0000 mg | Freq: Once | INTRAMUSCULAR | Status: AC
  Administered 2015-02-28: 4 mg via INTRAVENOUS

## 2015-02-28 MED ORDER — MORPHINE SULFATE 4 MG/ML IJ SOLN
INTRAMUSCULAR | Status: AC
  Administered 2015-02-28: 4 mg via INTRAVENOUS
  Filled 2015-02-28: qty 1

## 2015-02-28 MED ORDER — ACETAMINOPHEN 325 MG PO TABS: 650.0000 mg | ORAL_TABLET | Freq: Four times a day (QID) | ORAL | Status: DC | PRN

## 2015-02-28 MED ORDER — SODIUM CHLORIDE 0.9 % IV BOLUS (SEPSIS)
1000.0000 mL | Freq: Once | INTRAVENOUS | Status: AC
  Administered 2015-02-28: 1000 mL via INTRAVENOUS

## 2015-02-28 MED ORDER — LORAZEPAM 2 MG/ML IJ SOLN
INTRAMUSCULAR | Status: AC
  Administered 2015-02-28: 1 mg via INTRAVENOUS
  Filled 2015-02-28: qty 1

## 2015-02-28 MED ORDER — IOHEXOL 240 MG/ML SOLN
25.0000 mL | Freq: Once | INTRAMUSCULAR | Status: AC | PRN
  Administered 2015-02-28: 25 mL via ORAL

## 2015-02-28 MED ORDER — ONDANSETRON HCL 4 MG/2ML IJ SOLN
4.0000 mg | Freq: Four times a day (QID) | INTRAMUSCULAR | Status: DC
  Administered 2015-02-28 – 2015-03-01 (×6): 4 mg via INTRAVENOUS
  Filled 2015-02-28 (×7): qty 2

## 2015-02-28 MED ORDER — PANTOPRAZOLE SODIUM 40 MG IV SOLR
40.0000 mg | Freq: Two times a day (BID) | INTRAVENOUS | Status: DC
  Administered 2015-02-28 – 2015-03-01 (×4): 40 mg via INTRAVENOUS
  Filled 2015-02-28 (×5): qty 40

## 2015-02-28 MED ORDER — ACETAMINOPHEN 650 MG RE SUPP
650.0000 mg | Freq: Four times a day (QID) | RECTAL | Status: DC | PRN
Start: 2015-02-28 — End: 2015-03-02

## 2015-02-28 NOTE — ED Notes (Signed)
Patient resting comfortably after ativan 1 mg IV.

## 2015-02-28 NOTE — ED Notes (Signed)
Patient continues to complain of abdominal pain and nausea.  Dr. Cyril Loosen notified and will be reassessing patient.  Nurse will continue to monitor.

## 2015-02-28 NOTE — Consult Note (Signed)
GI Inpatient Consult Note  Reason for Consult:  Nausea, vomiting, hematemsis   Attending Requesting Consult: Dr. Renae Gloss  History of Present Illness: Blake Kerr is a 39 y.o. male reports that he has been vomiting countless times over the past 2 to 3 days.  He reports that this morning he started vomiting blood.  It started with "clots" of blood and now just pure blood.  While I was in the room he vomited again and it was extremely powerful retching with hematemeses.  He also reports sweating profusely during these episodes. He reports that this same type of episode occurred last month where he was treated at [facility].  He also reports that a couple of years ago he had two similar episodes, but none in between.  He reports that he is a daily cannabis smoker as well as a daily alcohol drinker (consuming 1-2 24 oz cans of beer daily).  He denies smoking smoking cannabis when the episodes a couple of years ago occurred.  He reports heartburn twice weekly for which he uses Tums.  He has never had an EGD or colonoscopy. He denies blood in stool, on tissue or dark, tarry stools.  Past Medical History:  History reviewed. No pertinent past medical history.  Problem List: Patient Active Problem List   Diagnosis Date Noted  . Nausea vomiting and diarrhea 02/28/2015    Past Surgical History: History reviewed. No pertinent past surgical history.  Allergies: No Known Allergies  Home Medications: Prescriptions prior to admission  Medication Sig Dispense Refill Last Dose  . ondansetron (ZOFRAN) 4 MG tablet Take 1 tablet (4 mg total) by mouth daily as needed for nausea or vomiting. 20 tablet 1 02/27/2015 at Unknown time  . promethazine (PHENERGAN) 25 MG tablet TAKE 1 TABLET BY MOUTH EVERY 6 HOURS AS NEEDED FOR NAUESA OR VOMITING  0 Not Taking at Unknown time   Home medication reconciliation was completed with the patient.   Scheduled Inpatient Medications:   . ondansetron (ZOFRAN) IV  4 mg  Intravenous 4 times per day  . pantoprazole (PROTONIX) IV  40 mg Intravenous Q12H    Continuous Inpatient Infusions:   . 0.9 % NaCl with KCl 20 mEq / L 125 mL/hr at 02/28/15 1428    PRN Inpatient Medications:  acetaminophen **OR** acetaminophen, morphine injection, promethazine  Family History: family history includes Hyperlipidemia in his father; Hypertension in his father and mother.  The patient's family history is negative for inflammatory bowel disorders, GI malignancy, or solid organ transplantation.  Social History:   reports that he has been smoking Cigarettes.  He has been smoking about 0.50 packs per day. He does not have any smokeless tobacco history on file. He reports that he drinks about 8.4 oz of alcohol per week. He reports that he uses illicit drugs (Marijuana).   Review of Systems: Constitutional: Weight is stable.  Eyes: No changes in vision. ENT: No oral lesions, sore throat.  GI: see HPI.  Heme/Lymph: No easy bruising.  CV: No chest pain.  GU: No hematuria.  Integumentary: No rashes.  Neuro: No headaches.  Psych: No depression/anxiety.  Endocrine: No heat/cold intolerance.  Allergic/Immunologic: No urticaria.  Resp: No cough, SOB.  Musculoskeletal: No joint swelling.    Physical Examination: BP 132/83 mmHg  Pulse 67  Temp(Src) 99.3 F (37.4 C) (Oral)  Resp 16  Ht  (1.854 m)  Wt 88.451 kg (195 lb)  BMI 25.73 kg/m2  SpO2 98% Gen:  alert and oriented  x 4, in moderate distress HEENT: PEERLA, EOMI, Neck: supple, no JVD or thyromegaly Chest: CTA bilaterally, no wheezes, crackles, or other adventitious sounds CV: RRR, no m/g/c/r Abd: diffusely tender, ND, +BS in all four quadrants; no HSM, guarding, ridigity, or rebound tenderness Ext: no edema, well perfused with 2+ pulses, Skin: no rash or lesions noted Lymph: no LAD  Data: Lab Results  Component Value Date   WBC 10.8* 02/28/2015   HGB 16.7 02/28/2015   HCT 49.5 02/28/2015   MCV 94.8  02/28/2015   PLT 260 02/28/2015    Recent Labs Lab 02/27/15 0806 02/28/15 0655  HGB 15.4 16.7   Lab Results  Component Value Date   NA 141 02/28/2015   K 3.5 02/28/2015   CL 102 02/28/2015   CO2 26 02/28/2015   BUN 13 02/28/2015   CREATININE 0.94 02/28/2015   Lab Results  Component Value Date   ALT 33 02/28/2015   AST 33 02/28/2015   ALKPHOS 68 02/28/2015   BILITOT 0.6 02/28/2015   No results for input(s): APTT, INR, PTT in the last 168 hours.   Imaging:  CLINICAL DATA: Epigastric pain, 2 days in a row. Nausea and vomiting.  EXAM: CT ABDOMEN AND PELVIS WITH CONTRAST  TECHNIQUE: Multidetector CT imaging of the abdomen and pelvis was performed using the standard protocol following bolus administration of intravenous contrast.  CONTRAST: OMNIPAQUE IOHEXOL 300 MG/ML SOLN, 41mL OMNIPAQUE IOHEXOL 240 MG/ML SOLN  COMPARISON: 01/17/2015  FINDINGS: Lower chest: Borderline distal esophageal wall thickening.  Hepatobiliary: Unremarkable  Pancreas: Unremarkable  Spleen: Unremarkable  Adrenals/Urinary Tract: Unremarkable  Stomach/Bowel: Unremarkable  Vascular/Lymphatic: Unremarkable  Reproductive: Unremarkable  Other: No supplemental non-categorized findings.  Musculoskeletal: Unremarkable  IMPRESSION: 1. There is some equivocal distal esophageal wall thickening which could indicate low grade distal esophagitis, but otherwise no specific cause for the patient's epigastric pain is identified.   Electronically Signed  By: Gaylyn Rong M.D.  On: 02/28/2015 09:19 Assessment/Plan: Mr. Poitier is a 39 y.o. male with nausea, vomiting and hematemesis  Recommendations: We agree with Protonix, Zofran and phenergan.  We recommend to continue to keep him NPO for possible endoscopy this weekend.  We recommend daily CBC's. Dr. Bluford Kaufmann will continue to follow over the weekend. Thank you for the consult. Please call with questions or  concerns.  Carney Harder, PA-C  I personally performed these services.

## 2015-02-28 NOTE — ED Provider Notes (Signed)
[facility] Emergency Department Provider Note  ____________________________________________  Time seen: 7:05 AM  I have reviewed the triage vital signs and the nursing notes.   HISTORY  Chief Complaint Abdominal Pain      HPI Blake Kerr is a 39 y.o. male who presents with epigastric pain nausea and vomiting. I saw this patient yesterday for similar complaints. At that time he received pain medication felt better and was discharged home. He states that his pain has returned overnight. He states he has been seen in several different facilities for similar complaints. He denies fevers chills. The pain is moderate and cramping in his epigastrium.Normal bowel movements     No past medical history on file.  There are no active problems to display for this patient.   No past surgical history on file.  Current Outpatient Rx  Name  Route  Sig  Dispense  Refill  . ondansetron (ZOFRAN) 4 MG tablet   Oral   Take 1 tablet (4 mg total) by mouth daily as needed for nausea or vomiting.   20 tablet   1   . promethazine (PHENERGAN) 25 MG tablet      TAKE 1 TABLET BY MOUTH EVERY 6 HOURS AS NEEDED FOR NAUESA OR VOMITING      0     Allergies Review of patient's allergies indicates no known allergies.  No family history on file.  Social History History  Substance Use Topics  . Smoking status: Current Every Day Smoker -- 0.50 packs/day    Types: Cigarettes  . Smokeless tobacco: Not on file  . Alcohol Use: 8.4 oz/week    14 Cans of beer per week    Review of Systems  Constitutional: Negative for fever. Eyes: Negative for visual changes. ENT: Negative for sore throat Cardiovascular: Negative for chest pain. Respiratory: Negative for shortness of breath. Gastrointestinal: Positive for abdominal pain and vomiting, negative for diarrhea Genitourinary: Negative for dysuria. Musculoskeletal: Negative for back pain. Skin: Negative for  rash. Neurological: Negative for headaches or focal weakness Psychiatric positive anxiety  10-point ROS otherwise negative.  ____________________________________________   PHYSICAL EXAM:  VITAL SIGNS: ED Triage Vitals  Enc Vitals Group     BP 02/28/15 0634 142/97 mmHg     Pulse Rate 02/28/15 0632 84     Resp 02/28/15 0632 18     Temp 02/28/15 0632 98.7 F (37.1 C)     Temp Source 02/28/15 0632 Oral     SpO2 02/28/15 0632 100 %     Weight 02/28/15 0632 195 lb (88.451 kg)     Height 02/28/15 0632 6\' 1"  (1.854 m)     Head Cir --      Peak Flow --      Pain Score 02/28/15 0633 10     Pain Loc --      Pain Edu? --      Excl. in GC? --      Constitutional: Alert and oriented. Well appearing and in no distress. Eyes: Conjunctivae are normal. PERRL. ENT   Head: Normocephalic and atraumatic.   Nose: No rhinnorhea.   Mouth/Throat: Mucous membranes are moist. Cardiovascular: Normal rate, regular rhythm. Normal and symmetric distal pulses are present in all extremities. No murmurs, rubs, or gallops. Respiratory: Normal respiratory effort without tachypnea nor retractions. Breath sounds are clear and equal bilaterally.  Gastrointestinal: Soft and non-tender in all quadrants. No distention. There is no CVA tenderness. Genitourinary: deferred Musculoskeletal: Nontender with normal range of motion  in all extremities. No lower extremity tenderness nor edema. Neurologic:  Normal speech and language. No gross focal neurologic deficits are appreciated. Skin:  Skin is warm, dry and intact. No rash noted.   ____________________________________________    LABS (pertinent positives/negatives)  Labs Reviewed  CBC  COMPREHENSIVE METABOLIC PANEL  LIPASE, BLOOD    ____________________________________________   EKG  None  ____________________________________________    RADIOLOGY  CT scan  unremarkable  ____________________________________________   PROCEDURES  Procedure(s) performed: none  Critical Care performed: none  ____________________________________________   INITIAL IMPRESSION / ASSESSMENT AND PLAN / ED COURSE  Pertinent labs & imaging results that were available during my care of the patient were reviewed by me and considered in my medical decision making (see chart for details).  Patient returns with similar complaints as yesterday. We will draw blood work again, the patient IV analgesic's and nausea medication. We may have to do a CT scan.  ____________________________________________ We will try 1 mg of Ativan as an anti-emetic  ----------------------------------------- 12:14 PM on 02/28/2015 ----------------------------------------- Patient has had multiple doses of nausea medication and pain medication and continues to not be able to tolerate by mouth's. We will admit the patient   FINAL CLINICAL IMPRESSION(S) / ED DIAGNOSES  Final diagnoses:  Intractable vomiting with nausea, vomiting of unspecified type     Jene Every, MD 02/28/15 1215

## 2015-02-28 NOTE — ED Notes (Signed)
Patient transported to CT 

## 2015-02-28 NOTE — ED Notes (Signed)
Patient returned from CT

## 2015-02-28 NOTE — H&P (Signed)
[facility]   PATIENT NAME: Blake Kerr    MR#:  [id number]  DATE OF BIRTH:  06/08/1976  DATE OF ADMISSION:  02/28/2015  PRIMARY CARE PHYSICIAN: No PCP Per Patient   REQUESTING/REFERRING PHYSICIAN: Jene Every  CHIEF COMPLAINT:   Chief Complaint  Patient presents with  . Abdominal Pain    HISTORY OF PRESENT ILLNESS:  Blake Kerr  is a 39 y.o. male with a known history of these vomiting episodes in the past going on for the past 2-3 years. Since Wednesday he's been having severe abdominal pain 10 out of 10 intensity with vomiting too many times to count. At least 50 times of vomiting over the past few days, now more recently with blood in the vomit. He came to the ER yesterday and was treated with supportive care and discharged home. As soon as he got home he started vomiting again and came back to the ER today. Pain in the abdomen is constant throughout the entire abdomen, it will dull off at times and then be very sharp and cramping. Nothing makes this better or worse. He is also had 4 or 5 episodes of diarrhea. No blood in the bowel movements.  PAST MEDICAL HISTORY:  History reviewed. No pertinent past medical history.  PAST SURGICAL HISTORY:  History reviewed. No pertinent past surgical history.  SOCIAL HISTORY:   History  Substance Use Topics  . Smoking status: Current Every Day Smoker -- 0.50 packs/day    Types: Cigarettes  . Smokeless tobacco: Not on file  . Alcohol Use: 8.4 oz/week    14 Cans of beer per week    FAMILY HISTORY:  History reviewed. No pertinent family history.  DRUG ALLERGIES:  No Known Allergies  REVIEW OF SYSTEMS:  CONSTITUTIONAL: Positive for fever, no fatigue or weakness.  EYES: No blurred or double vision.  EARS, NOSE, AND THROAT: No tinnitus or ear pain. No sore throat RESPIRATORY: No cough, shortness of breath, wheezing or hemoptysis.  CARDIOVASCULAR: No chest pain,  orthopnea, edema.  GASTROINTESTINAL: Positive for nausea, vomiting, diarrhea and abdominal pain. Positive for hematemesis. No blood in bowel movements. GENITOURINARY: No dysuria, hematuria.  ENDOCRINE: No polyuria, nocturia,  HEMATOLOGY: No anemia, easy bruising or bleeding SKIN: No rash or lesion. MUSCULOSKELETAL: No joint pain or arthritis.   NEUROLOGIC: No tingling, numbness, weakness.  PSYCHIATRY: No anxiety or depression.   MEDICATIONS AT HOME:   Prior to Admission medications   Medication Sig Start Date End Date Taking? Authorizing Provider  ondansetron (ZOFRAN) 4 MG tablet Take 1 tablet (4 mg total) by mouth daily as needed for nausea or vomiting. 02/27/15  Yes Jene Every, MD  promethazine (PHENERGAN) 25 MG tablet TAKE 1 TABLET BY MOUTH EVERY 6 HOURS AS NEEDED FOR NAUESA OR VOMITING 01/17/15   Historical Provider, MD      VITAL SIGNS:  Blood pressure 126/85, pulse 64, temperature 99.1 F (37.3 C), temperature source Oral, resp. rate 16, height 6\' 1"  (1.854 m), weight 88.451 kg (195 lb), SpO2 98 %.  PHYSICAL EXAMINATION:  GENERAL:  39 y.o.-year-old patient lying in the bed with no acute distress.  EYES: Pupils equal, round, reactive to light and accommodation. No scleral icterus. Extraocular muscles intact.  HEENT: Head atraumatic, normocephalic. Oropharynx and nasopharynx clear.  NECK:  Supple, no jugular venous distention. No thyroid enlargement, no tenderness.  LUNGS: Normal breath sounds bilaterally, no wheezing, rales,rhonchi or crepitation. No use of accessory muscles of respiration.  CARDIOVASCULAR: S1,  S2 normal. No murmurs, rubs, or gallops.  ABDOMEN: Soft, positive tenderness throughout entire abdomen, nondistended. Bowel sounds present. No organomegaly or mass.  EXTREMITIES: No pedal edema, cyanosis, or clubbing.  NEUROLOGIC: Cranial nerves II through XII are intact. Muscle strength 5/5 in all extremities. Sensation intact. Gait not checked.  PSYCHIATRIC: The patient  is alert and oriented x 3.  SKIN: No rash, lesion, or ulcer.   LABORATORY PANEL:   CBC  Recent Labs Lab 02/28/15 0655  WBC 10.8*  HGB 16.7  HCT 49.5  PLT 260   Chemistries   Recent Labs Lab 02/28/15 0655  NA 141  K 3.5  CL 102  CO2 26  GLUCOSE 149*  BUN 13  CREATININE 0.94  CALCIUM 9.9  AST 33  ALT 33  ALKPHOS 68  BILITOT 0.6     RADIOLOGY:  Ct Abdomen Pelvis W Contrast  02/28/2015   CLINICAL DATA:  Epigastric pain, 2 days in a row. Nausea and vomiting.  EXAM: CT ABDOMEN AND PELVIS WITH CONTRAST  TECHNIQUE: Multidetector CT imaging of the abdomen and pelvis was performed using the standard protocol following bolus administration of intravenous contrast.  CONTRAST:  OMNIPAQUE IOHEXOL 300 MG/ML SOLN, 25mL OMNIPAQUE IOHEXOL 240 MG/ML SOLN  COMPARISON:  01/17/2015  FINDINGS: Lower chest:  Borderline distal esophageal wall thickening.  Hepatobiliary: Unremarkable  Pancreas: Unremarkable  Spleen: Unremarkable  Adrenals/Urinary Tract: Unremarkable  Stomach/Bowel: Unremarkable  Vascular/Lymphatic: Unremarkable  Reproductive: Unremarkable  Other: No supplemental non-categorized findings.  Musculoskeletal: Unremarkable  IMPRESSION: 1. There is some equivocal distal esophageal wall thickening which could indicate low grade distal esophagitis, but otherwise no specific cause for the patient's epigastric pain is identified.   Electronically Signed   By: Gaylyn Rong M.D.   On: 02/28/2015 09:19   Dg Chest Portable 1 View  02/27/2015   CLINICAL DATA:  Lower abdominal pain since yesterday, smoker  EXAM: PORTABLE CHEST - 1 VIEW  COMPARISON:  Portable exam 0815 hours compared to 01/17/2015  FINDINGS: Normal heart size, mediastinal contours, and pulmonary vascularity.  Lungs clear.  No pneumothorax.  Bones unremarkable.  IMPRESSION: Normal exam.   Electronically Signed   By: Ulyses Southward M.D.   On: 02/27/2015 08:27    EKG:   Not done  IMPRESSION AND PLAN:   1. Abdominal pain  generalized, nausea vomiting and diarrhea. Some hematemesis. The patient does have a history of this happening in the past. 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 I advised him to stop smoking cannabis. I advised him to stop alcohol. Supportive care will be given. I will give IV fluid hydration. Consult GI. IV Protonix. Standing dose of IV Zofran, when necessary IM Phenergan. Observe her overnight. I will keep nothing by mouth for right now just in case GI wants to do an endoscopy. I will get serial hemoglobins. I will send off stool studies. 2. Impaired fasting glucose I will check a hemoglobin A1c but glucose can be elevated with vomiting. 3. Leukocytosis- likely secondary to from vomiting. 4. Esophageal thickening seen on CT scan- this could also be from repeated vomiting.  All the records are reviewed and case discussed with ED provider. Management plans discussed with the patient, family and they are in agreement.  CODE STATUS: Full code  TOTAL TIME TAKING CARE OF THIS PATIENT: 50 minutes.    Alford Highland M.D on 02/28/2015 at 1:00 PM  Between 7am to 6pm - Pager - [phone]  After 6pm call admission pager [phone]  [facility]  Office  [phone]  CC: Primary care physician; No PCP Per Patient

## 2015-02-28 NOTE — ED Notes (Signed)
Pt in with co abd pain since Wednesday, was seen here last night for the same dx home with zofran.

## 2015-03-01 ENCOUNTER — Encounter: Payer: Self-pay | Admitting: Gastroenterology

## 2015-03-01 LAB — HEMOGLOBIN A1C: Hgb A1c MFr Bld: 5.3 % (ref 4.0–6.0)

## 2015-03-01 LAB — BASIC METABOLIC PANEL
Anion gap: 5 (ref 5–15)
BUN: 13 mg/dL (ref 6–20)
CALCIUM: 8.6 mg/dL — AB (ref 8.9–10.3)
CO2: 26 mmol/L (ref 22–32)
CREATININE: 0.84 mg/dL (ref 0.61–1.24)
Chloride: 109 mmol/L (ref 101–111)
GFR calc non Af Amer: >60 mL/min (ref >60)
Glucose, Bld: 95 mg/dL (ref 65–99)
Potassium: 3.8 mmol/L (ref 3.5–5.1)
Sodium: 140 mmol/L (ref 135–145)

## 2015-03-01 LAB — CBC
HEMATOCRIT: 41.4 % (ref 40.0–52.0)
HEMOGLOBIN: 14 g/dL (ref 13.0–18.0)
MCH: 32.1 pg (ref 26.0–34.0)
MCHC: 33.7 g/dL (ref 32.0–36.0)
MCV: 95.2 fL (ref 80.0–100.0)
Platelets: 207 10*3/uL (ref 150–440)
RBC: 4.35 MIL/uL — AB (ref 4.40–5.90)
RDW: 12.9 % (ref 11.5–14.5)
WBC: 7.3 10*3/uL (ref 3.8–10.6)

## 2015-03-01 MED ORDER — PEG 3350-KCL-NA BICARB-NACL 420 G PO SOLR
4000.0000 mL | Freq: Once | ORAL | Status: DC
  Filled 2015-03-01: qty 4000

## 2015-03-01 MED ORDER — SODIUM CHLORIDE 0.9 % IV SOLN: INTRAVENOUS | Status: DC

## 2015-03-01 MED ORDER — PANTOPRAZOLE SODIUM 40 MG PO TBEC: 40.0000 mg | DELAYED_RELEASE_TABLET | Freq: Every day | ORAL | Status: DC

## 2015-03-01 NOTE — Progress Notes (Signed)
[facility] - [place] at [facility]                                                                                                                                                                                            Patient Demographics   Blake Kerr, is a 39 y.o. male, DOB - October 30, 1975, TSV:[id number]  Admit date - 02/28/2015   Admitting Physician Alford Highland, MD  Outpatient Primary MD for the patient is No PCP Per Patient   LOS -   Subjective:  Patient reports that his nausea and abdominal pain is improved. Currently on clear liquid diet was seen by GI.     Review of Systems:   CONSTITUTIONAL: No documented fever. No fatigue, weakness. No weight gain, no weight loss.  EYES: No blurry or double vision.  ENT: No tinnitus. No postnasal drip. No redness of the oropharynx.  RESPIRATORY: No cough, no wheeze, no hemoptysis. No dyspnea.  CARDIOVASCULAR: No chest pain. No orthopnea. No palpitations. No syncope.  GASTROINTESTINAL: Positive nausea, positive vomiting  and abdominal pain. No melena or hematochezia.  GENITOURINARY: No dysuria or hematuria.  ENDOCRINE: No polyuria or nocturia. No heat or cold intolerance.  HEMATOLOGY: No anemia. No bruising. No bleeding.  INTEGUMENTARY: No rashes. No lesions.  MUSCULOSKELETAL: No arthritis. No swelling. No gout.  NEUROLOGIC: No numbness, tingling, or ataxia. No seizure-type activity.  PSYCHIATRIC: No anxiety. No insomnia. No ADD.    Vitals:   Filed Vitals:   02/28/15 2040 03/01/15 0013 03/01/15 0405 03/01/15 0812  BP: 136/90 119/60 107/52 129/77  Pulse: 79 66 57 57  Temp: 99.8 F (37.7 C) 98.4 F (36.9 C) 98.1 F (36.7 C) 97.9 F (36.6 C)  TempSrc: Oral Oral Oral Oral  Resp: 20 18 16 18   Height:      Weight:      SpO2: 100% 99% 98% 100%    Wt Readings from Last 3 Encounters:  02/28/15 88.451 kg (195 lb)  02/27/15 86.183 kg (190 lb)  01/17/15 90.719 kg (200 lb)     Intake/Output  Summary (Last 24 hours) at 03/01/15 1052 Last data filed at 03/01/15 9233  Gross per 24 hour  Intake 2077.84 ml  Output    200 ml  Net 1877.84 ml    Physical Exam:   GENERAL: Pleasant-appearing in no apparent distress.  HEAD, EYES, EARS, NOSE AND THROAT: Atraumatic, normocephalic. Extraocular muscles are intact. Pupils equal and reactive to light. Sclerae anicteric. No conjunctival injection. No oro-pharyngeal erythema.  NECK: Supple. There is no jugular venous distention. No bruits, no lymphadenopathy, no thyromegaly.  HEART: Regular rate and rhythm,  tachycardic. No murmurs, no rubs, no clicks.  LUNGS: Clear to auscultation bilaterally. No rales or rhonchi. No wheezes.  ABDOMEN: Soft, flat, nontender, nondistended. Has good bowel sounds. No hepatosplenomegaly appreciated.  EXTREMITIES: No evidence of any cyanosis, clubbing, or peripheral edema.  +2 pedal and radial pulses bilaterally.  NEUROLOGIC: The patient is alert, awake, and oriented x3 with no focal motor or sensory deficits appreciated bilaterally.  SKIN: Moist and warm with no rashes appreciated.  Psych: Not anxious, depressed LN: No inguinal LN enlargement    Antibiotics   Anti-infectives    None      Medications   Scheduled Meds: . ondansetron (ZOFRAN) IV  4 mg Intravenous 4 times per day  . pantoprazole (PROTONIX) IV  40 mg Intravenous Q12H   Continuous Infusions: . 0.9 % NaCl with KCl 20 mEq / L 125 mL/hr at 03/01/15 0552   PRN Meds:.acetaminophen **OR** acetaminophen, morphine injection, promethazine   Data Review:   Micro Results Recent Results (from the past 240 hour(s))  C difficile quick scan w PCR reflex ([facility] only)     Status: None   Collection Time: 02/28/15  9:00 PM  Result Value Ref Range Status   C Diff antigen NEGATIVE  Final   C Diff toxin NEGATIVE  Final   C Diff interpretation Negative for C. difficile  Final    Radiology Reports Ct Abdomen Pelvis W Contrast  02/28/2015   CLINICAL  DATA:  Epigastric pain, 2 days in a row. Nausea and vomiting.  EXAM: CT ABDOMEN AND PELVIS WITH CONTRAST  TECHNIQUE: Multidetector CT imaging of the abdomen and pelvis was performed using the standard protocol following bolus administration of intravenous contrast.  CONTRAST:  OMNIPAQUE IOHEXOL 300 MG/ML SOLN, 25mL OMNIPAQUE IOHEXOL 240 MG/ML SOLN  COMPARISON:  01/17/2015  FINDINGS: Lower chest:  Borderline distal esophageal wall thickening.  Hepatobiliary: Unremarkable  Pancreas: Unremarkable  Spleen: Unremarkable  Adrenals/Urinary Tract: Unremarkable  Stomach/Bowel: Unremarkable  Vascular/Lymphatic: Unremarkable  Reproductive: Unremarkable  Other: No supplemental non-categorized findings.  Musculoskeletal: Unremarkable  IMPRESSION: 1. There is some equivocal distal esophageal wall thickening which could indicate low grade distal esophagitis, but otherwise no specific cause for the patient's epigastric pain is identified.   Electronically Signed   By: Gaylyn Rong M.D.   On: 02/28/2015 09:19   Dg Chest Portable 1 View  02/27/2015   CLINICAL DATA:  Lower abdominal pain since yesterday, smoker  EXAM: PORTABLE CHEST - 1 VIEW  COMPARISON:  Portable exam 0815 hours compared to 01/17/2015  FINDINGS: Normal heart size, mediastinal contours, and pulmonary vascularity.  Lungs clear.  No pneumothorax.  Bones unremarkable.  IMPRESSION: Normal exam.   Electronically Signed   By: Ulyses Southward M.D.   On: 02/27/2015 08:27     CBC  Recent Labs Lab 02/27/15 0806 02/28/15 0655 03/01/15 0454  WBC 14.9* 10.8* 7.3  HGB 15.4 16.7 14.0  HCT 45.7 49.5 41.4  PLT 247 260 207  MCV 93.8 94.8 95.2  MCH 31.7 32.0 32.1  MCHC 33.8 33.8 33.7  RDW 13.1 13.0 12.9    Chemistries   Recent Labs Lab 02/27/15 0806 02/28/15 0655 03/01/15 0454  NA 139 141 140  K 3.6 3.5 3.8  CL 100* 102 109  CO2 GLUCOSE 162* 149* 95  BUN CREATININE 0.86 0.94 0.84  CALCIUM 9.6 9.9 8.6*  AST 34 33  --    ALT 32 33  --   ALKPHOS 79 68  --  BILITOT 0.8 0.6  --    ------------------------------------------------------------------------------------------------------------------ estimated creatinine clearance is 136.1 mL/min (by C-G formula based on Cr of 0.84). ------------------------------------------------------------------------------------------------------------------ No results for input(s): HGBA1C in the last 72 hours. ------------------------------------------------------------------------------------------------------------------ No results for input(s): CHOL, HDL, LDLCALC, TRIG, CHOLHDL, LDLDIRECT in the last 72 hours. ------------------------------------------------------------------------------------------------------------------ No results for input(s): TSH, T4TOTAL, T3FREE, THYROIDAB in the last 72 hours.  Invalid input(s): FREET3 ------------------------------------------------------------------------------------------------------------------ No results for input(s): VITAMINB12, FOLATE, FERRITIN, TIBC, IRON, RETICCTPCT in the last 72 hours.  Coagulation profile No results for input(s): INR, PROTIME in the last 168 hours.  No results for input(s): DDIMER in the last 72 hours.  Cardiac Enzymes No results for input(s): CKMB, TROPONINI, MYOGLOBIN in the last 168 hours.  Invalid input(s): CK ------------------------------------------------------------------------------------------------------------------ Invalid input(s): POCBNP    Assessment & Plan   1. Abdominal pain generalized, nausea vomiting and diarrhea. Some hematemesis. The patient does have a history of this happening in the past. 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 CT of the abdomen suggestive of inflammation in the esophagus plan for endoscopy tomorrow, continue clear liquids for now 2. Impaired fasting glucose hemoglobin A1c pending  3. Leukocytosis- likely secondary to from vomiting  Due to stress  reaction 4. Esophageal thickening seen on CT scan- differential include esophagitis, gastritis plan for EGD tomorrow morning     Code Status Orders        Start     Ordered   02/28/15 1255  Full code   Continuous     02/28/15 1256           Consults GI DVT Prophylaxis  ambulatory  Lab Results  Component Value Date   PLT 207 03/01/2015     Time Spent in minutes   40 minutes Greater than 50% of time spent on counseling    Auburn Bilberry M.D on 03/01/2015 at 10:52 AM  Between 7am to 6pm - Pager - [phone]  After 6pm go to [email or web address] - password [email or web address] [facility]  [facility] [place] [facility]   Office  [phone]

## 2015-03-01 NOTE — Consult Note (Signed)
  GI Inpatient Follow-up Note  Patient Identification: Blake Kerr is a 39 y.o. male with recurrent bouts of hemetemesis.  Subjective:Less nausea this AM. C/O epigastric pain. Intermittent hx of GERD. Hemetemesis usually at the end of significant retching. Hgb remains stable. NPO right now.  Scheduled Inpatient Medications:  . ondansetron (ZOFRAN) IV  4 mg Intravenous 4 times per day  . pantoprazole (PROTONIX) IV  40 mg Intravenous Q12H  . polyethylene glycol-electrolytes  4,000 mL Oral Once    Continuous Inpatient Infusions:   . sodium chloride    . 0.9 % NaCl with KCl 20 mEq / L 125 mL/hr at 03/01/15 0552    PRN Inpatient Medications:  acetaminophen **OR** acetaminophen, morphine injection, promethazine  Review of Systems: Constitutional: Weight is stable.  Eyes: No changes in vision. ENT: No oral lesions, sore throat.  GI: see HPI.  Heme/Lymph: No easy bruising.  CV: No chest pain.  GU: No hematuria.  Integumentary: No rashes.  Neuro: No headaches.  Psych: No depression/anxiety.  Endocrine: No heat/cold intolerance.  Allergic/Immunologic: No urticaria.  Resp: No cough, SOB.  Musculoskeletal: No joint swelling.    Physical Examination: BP 129/77 mmHg  Pulse 57  Temp(Src) 97.9 F (36.6 C) (Oral)  Resp 18  Ht 6\' 1"  (1.854 m)  Wt 88.451 kg (195 lb)  BMI 25.73 kg/m2  SpO2 100% Gen: NAD, alert and oriented x 4 HEENT: PEERLA, EOMI, Neck: supple, no JVD or thyromegaly Chest: CTA bilaterally, no wheezes, crackles, or other adventitious sounds CV: RRR, no m/g/c/r Abd: soft,mild epigastric tenderness, ND, +BS in all four quadrants; no HSM, guarding, ridigity, or rebound tenderness Ext: no edema, well perfused with 2+ pulses, Skin: no rash or lesions noted Lymph: no LAD  Data: Lab Results  Component Value Date   WBC 7.3 03/01/2015   HGB 14.0 03/01/2015   HCT 41.4 03/01/2015   MCV 95.2 03/01/2015   PLT 207 03/01/2015    Recent Labs Lab 02/27/15 0806  02/28/15 0655 03/01/15 0454  HGB 15.4 16.7 14.0   Lab Results  Component Value Date   NA 140 03/01/2015   K 3.8 03/01/2015   CL 109 03/01/2015   CO2 26 03/01/2015   BUN 13 03/01/2015   CREATININE 0.84 03/01/2015   Lab Results  Component Value Date   ALT 33 02/28/2015   AST 33 02/28/2015   ALKPHOS 68 02/28/2015   BILITOT 0.6 02/28/2015   No results for input(s): APTT, INR, PTT in the last 168 hours. Assessment/Plan: Blake Kerr is a 39 y.o. male with hemetemesis. No vomiting overnight. Pt could have reflux esophagitis, as suggested by CT, M-W tear, alcohol-induced gastritis, or PUD.  Recommendations: Clear liquid diet today. NPO after MN. EGD in AM. Continue protonix iv and moniter CBC. Thanks. Please call with questions or concerns.  Evvie Behrmann, Ezzard Standing, MD

## 2015-03-01 NOTE — Progress Notes (Signed)
Initial Nutrition Assessment  INTERVENTION:  Meals and Snacks: Cater to patient preferences once diet order able to be advanced    NUTRITION DIAGNOSIS:  Inadequate oral intake related to inability to eat as evidenced by NPO status.  GOAL:   (Diet advancement as medically able within 5-7 days)  MONITOR:   (Energy Intake, Electrolyte and renal Profile, Digestive system)  REASON FOR ASSESSMENT:  Malnutrition Screening Tool    ASSESSMENT:  Pt admitted with n/v/d for the past 2-3 days. Per MD note EGD in am.  PMHx:  History reviewed. No pertinent past medical history.   Current Nutrition: Pt NPo since admission, CL just ordered   Food/Nutrition-Related History: Pt reports eating as usual until Wednesday when the n/v started, then had poor po intake and poor appetite as nothing would stay down.   Medications: Zofran, NS with KCl at 127mL/hr, Protonix  Electrolyte/Renal Profile and Glucose Profile:   Recent Labs Lab 02/27/15 0806 02/28/15 0655 03/01/15 0454  NA 139 141 140  K 3.6 3.5 3.8  CL 100* 102 109  CO2 25 26 26   BUN 13 13 13   CREATININE 0.86 0.94 0.84  CALCIUM 9.6 9.9 8.6*  GLUCOSE 162* 149* 95   Protein Profile:  Recent Labs Lab 02/27/15 0806 02/28/15 0655  ALBUMIN 4.8 5.1*    Gastrointestinal Profile: Patient Vitals for the past 24 hrs:  Urine Occurrence Stool Color  03/01/15 0500 1 -  03/01/15 0013 1 -  02/28/15 2244 1 -  02/28/15 2051 - Brown  02/28/15 2044 1 Brown     Weight Change: Pt reports UBW of 195lbs, unsure of weight trend, per CHL weight of 200lbs 01/17/2015  Height:  Ht Readings from Last 1 Encounters:  02/28/15 6\' 1"  (1.854 m)    Weight:  Wt Readings from Last 1 Encounters:  02/28/15 195 lb (88.451 kg)    Wt Readings from Last 10 Encounters:  02/28/15 195 lb (88.451 kg)  02/27/15 190 lb (86.183 kg)  01/17/15 200 lb (90.719 kg)    BMI:  Body mass index is 25.73 kg/(m^2).  Skin:  Reviewed, no issues  Diet  Order:  Diet clear liquid Room service appropriate?: Yes; Fluid consistency:: Thin Diet NPO time specified  EDUCATION NEEDS:  No education needs identified at this time   Intake/Output Summary (Last 24 hours) at 03/01/15 1200 Last data filed at 03/01/15 0812  Gross per 24 hour  Intake 2077.84 ml  Output    200 ml  Net 1877.84 ml     LOW Care Level  Leda Quail, RD, LDN Pager [phone]

## 2015-03-02 ENCOUNTER — Observation Stay: Payer: Self-pay | Admitting: Anesthesiology

## 2015-03-02 ENCOUNTER — Encounter: Payer: Self-pay | Admitting: Certified Registered Nurse Anesthetist

## 2015-03-02 ENCOUNTER — Encounter
Admission: EM | Disposition: A | Discharge status: Home / Self Care | Payer: Self-pay | Source: Home / Self Care | Attending: Emergency Medicine

## 2015-03-02 HISTORY — PX: ESOPHAGOGASTRODUODENOSCOPY: SHX5428

## 2015-03-02 SURGERY — EGD (ESOPHAGOGASTRODUODENOSCOPY)
Anesthesia: General | Laterality: Bilateral

## 2015-03-02 SURGERY — EGD (ESOPHAGOGASTRODUODENOSCOPY)
Anesthesia: Monitor Anesthesia Care | Laterality: Left

## 2015-03-02 SURGERY — EGD (ESOPHAGOGASTRODUODENOSCOPY)
Anesthesia: General

## 2015-03-02 MED ORDER — PANTOPRAZOLE SODIUM 40 MG PO TBEC
40.0000 mg | DELAYED_RELEASE_TABLET | Freq: Two times a day (BID) | ORAL | Status: DC
  Administered 2015-03-02: 40 mg via ORAL
  Filled 2015-03-02: qty 1

## 2015-03-02 MED ORDER — MIDAZOLAM HCL 5 MG/5ML IJ SOLN
INTRAMUSCULAR | Status: DC | PRN
  Administered 2015-03-02: 1 mg via INTRAVENOUS

## 2015-03-02 MED ORDER — LACTATED RINGERS IV SOLN
INTRAVENOUS | Status: DC | PRN
  Administered 2015-03-02: 08:00:00 via INTRAVENOUS

## 2015-03-02 MED ORDER — FENTANYL CITRATE (PF) 100 MCG/2ML IJ SOLN
INTRAMUSCULAR | Status: DC | PRN
  Administered 2015-03-02: 50 ug via INTRAVENOUS

## 2015-03-02 MED ORDER — PANTOPRAZOLE SODIUM 40 MG PO TBEC: 40.0000 mg | DELAYED_RELEASE_TABLET | Freq: Two times a day (BID) | ORAL | Status: DC

## 2015-03-02 MED ORDER — SODIUM CHLORIDE 0.9 % IV SOLN
INTRAVENOUS | Status: DC
  Administered 2015-03-02: 1000 mL via INTRAVENOUS

## 2015-03-02 MED ORDER — PROPOFOL 10 MG/ML IV BOLUS
INTRAVENOUS | Status: DC | PRN
  Administered 2015-03-02: 40 mg via INTRAVENOUS
  Administered 2015-03-02: 60 mg via INTRAVENOUS
  Administered 2015-03-02: 20 mg via INTRAVENOUS

## 2015-03-02 MED ORDER — PROPOFOL INFUSION 10 MG/ML OPTIME
INTRAVENOUS | Status: DC | PRN
  Administered 2015-03-02: 140 ug/kg/min via INTRAVENOUS

## 2015-03-02 NOTE — Op Note (Signed)
[facility] Patient Name: Blake Kerr Procedure Date: 03/02/2015 8:08 AM MRN: [id number] Account #: [id number] Date of Birth: January 02, 1976 Admit Type: Inpatient Age: 39 Room: [facility] ENDO ROOM 4 Gender: Male Note Status: Finalized Procedure:         Upper GI endoscopy Indications:       Hematemesis Providers:         Ezzard Standing. Bluford Kaufmann, MD Referring MD:      Sallye Lat Md, MD (Referring MD) Medicines:         Monitored Anesthesia Care Complications:     No immediate complications. Procedure:         Pre-Anesthesia Assessment:                    - Prior to the procedure, a History and Physical was                     performed, and patient medications, allergies and                     sensitivities were reviewed. The patient's tolerance of                     previous anesthesia was reviewed.                    - The risks and benefits of the procedure and the sedation                     options and risks were discussed with the patient. All                     questions were answered and informed consent was obtained.                    - After reviewing the risks and benefits, the patient was                     deemed in satisfactory condition to undergo the procedure.                    After obtaining informed consent, the endoscope was passed                     under direct vision. Throughout the procedure, the                     patient's blood pressure, pulse, and oxygen saturations                     were monitored continuously. The Olympus GIF-160 endoscope                     (S#. [id number]) was introduced through the mouth, and                     advanced to the second part of duodenum. The upper GI                     endoscopy was accomplished without difficulty. The patient                     tolerated the procedure well. Findings:      LA Grade B (one or more mucosal breaks greater than 5 mm,  not extending       between the tops of two  mucosal folds) esophagitis with no bleeding was       found in the lower third of the esophagus. Biopsies were taken with a       cold forceps for histology.      The entire examined stomach was normal.      The examined duodenum was normal.      The exam of the esophagus was otherwise normal. Impression:        - LA Grade B reflux esophagitis. Biopsied.                    - Normal stomach.                    - Normal examined duodenum. Recommendation:    - Observe patient's clinical course.                    - Continue present medications.                    - The findings and recommendations were discussed with the                     patient's family. Procedure Code(s): --- Professional ---                    [phone], Esophagogastroduodenoscopy, flexible, transoral;                     with biopsy, single or multiple Diagnosis Code(s): --- Professional ---                    K21.0, Gastro-esophageal reflux disease with esophagitis                    K92.0, Hematemesis CPT copyright 2014 American Medical Association. All rights reserved. The codes documented in this report are preliminary and upon coder review may  be revised to meet current compliance requirements. Wallace Cullens, MD 03/02/2015 8:29:07 AM This report has been signed electronically. Number of Addenda: 0 Note Initiated On: 03/02/2015 8:08 AM      [facility]

## 2015-03-02 NOTE — Transfer of Care (Signed)
Immediate Anesthesia Transfer of Care Note  Patient: Blake Kerr  Procedure(s) Performed: Procedure(s): ESOPHAGOGASTRODUODENOSCOPY (EGD) (N/A)  Patient Location: PACU  Anesthesia Type:General  Level of Consciousness: awake, alert  and oriented  Airway & Oxygen Therapy: Patient Spontanous Breathing and Patient connected to nasal cannula oxygen  Post-op Assessment: Report given to RN and Post -op Vital signs reviewed and stable  Post vital signs: Reviewed and stable  Last Vitals:  Filed Vitals:   03/02/15 0744  BP: 131/74  Pulse: 62  Temp: 36.7 C  Resp: 17    Complications: No apparent anesthesia complications

## 2015-03-02 NOTE — Anesthesia Postprocedure Evaluation (Signed)
  Anesthesia Post-op Note  Patient: Blake Kerr  Procedure(s) Performed: Procedure(s): ESOPHAGOGASTRODUODENOSCOPY (EGD) (N/A)  Anesthesia type:General  Patient location: PACU  Post pain: Pain level controlled  Post assessment: Post-op Vital signs reviewed, Patient's Cardiovascular Status Stable, Respiratory Function Stable, Patent Airway and No signs of Nausea or vomiting  Post vital signs: Reviewed and stable  Last Vitals:  Filed Vitals:   03/02/15 0933  BP: 135/76  Pulse: 67  Temp: 36.6 C  Resp: 16    Level of consciousness: awake, alert  and patient cooperative  Complications: No apparent anesthesia complications

## 2015-03-02 NOTE — Discharge Instructions (Signed)
°  DIET:  Regular diet  DISCHARGE CONDITION:  Good  ACTIVITY:  Activity as tolerated  OXYGEN:  Home Oxygen: No.   Oxygen Delivery: room air  DISCHARGE LOCATION:  home    ADDITIONAL DISCHARGE INSTRUCTION:   If you experience worsening of your admission symptoms, develop shortness of breath, life threatening emergency, suicidal or homicidal thoughts you must seek medical attention immediately by calling 911 or calling your MD immediately  if symptoms less severe.  You Must read complete instructions/literature along with all the possible adverse reactions/side effects for all the Medicines you take and that have been prescribed to you. Take any new Medicines after you have completely understood and accpet all the possible adverse reactions/side effects.   Please note  You were cared for by a hospitalist during your hospital stay. If you have any questions about your discharge medications or the care you received while you were in the hospital after you are discharged, you can call the unit and asked to speak with the hospitalist on call if the hospitalist that took care of you is not available. Once you are discharged, your primary care physician will handle any further medical issues. Please note that NO REFILLS for any discharge medications will be authorized once you are discharged, as it is imperative that you return to your primary care physician (or establish a relationship with a primary care physician if you do not have one) for your aftercare needs so that they can reassess your need for medications and monitor your lab values.  *Take all medications as prescribed. *Drink plenty of fluids and advance diet as tolerated.  *Refrain from drinking alcohol. *Notify your doctor if your symptoms return and/or become worsen.  *Notify your doctor with all questions or concerns.

## 2015-03-02 NOTE — Progress Notes (Signed)
MD ordered patient to be discharged home.  Discharge instructions were reviewed with the patient and he voiced understanding.  Prescription given to the patient.  Patient instructed on calling for his follow-up appointment.  IV was removed with catheter intact.  All patient questions were answered.  Patient refused to go out in a wheelchair and walked out with his significant other.

## 2015-03-02 NOTE — Anesthesia Preprocedure Evaluation (Addendum)
Anesthesia Evaluation  Patient identified by MRN, date of birth, ID band Patient awake    Reviewed: Allergy & Precautions, NPO status , Patient's Chart, lab work & pertinent test results  Airway Mallampati: I  TM Distance: >3 FB Neck ROM: Full    Dental  (+) Teeth Intact   Pulmonary Current Smoker (1/2 ppd),          Cardiovascular     Neuro/Psych    GI/Hepatic GERD-  Medicated and Poorly Controlled,  Endo/Other    Renal/GU      Musculoskeletal   Abdominal   Peds  Hematology   Anesthesia Other Findings   Reproductive/Obstetrics                            Anesthesia Physical Anesthesia Plan  ASA: II  Anesthesia Plan: General   Post-op Pain Management:    Induction: Intravenous  Airway Management Planned: Nasal Cannula  Additional Equipment:   Intra-op Plan:   Post-operative Plan:   Informed Consent: I have reviewed the patients History and Physical, chart, labs and discussed the procedure including the risks, benefits and alternatives for the proposed anesthesia with the patient or authorized representative who has indicated his/her understanding and acceptance.     Plan Discussed with:   Anesthesia Plan Comments:         Anesthesia Quick Evaluation

## 2015-03-02 NOTE — Discharge Summary (Signed)
Blake Kerr, 39 y.o., DOB 1976-01-14, MRN [id number]. Admission date: 02/28/2015 Discharge Date 03/02/2015 Primary MD No PCP Per Patient Admitting Physician Alford Highland, MD  Admission Diagnosis  Intractable vomiting with nausea, vomiting of unspecified type [R11.10]  Discharge Diagnosis   Active Problems:  Grade 2 esophagitis Abdominal pain        Hospital Course Blake Kerr is a 39 y.o. male with a known history of these vomiting episodes in the past going on for the past 2-3 years. Since Wednesday he's been having severe abdominal pain 10 out of 10 intensity with vomiting too many times to count. At least 50 times of vomiting over the past few days, now more recently with blood in the vomit. He came to the ER was treated with supportive care and discharged home. As soon as he got home he started vomiting again and came back to the ER. Patient had a CT scan of his abdomen which did show some inflammatory changes in his esophagus. He was admitted under observation for supportive careplaced on Protonix. He underwent endoscopy earlier today which showed grade 2 reflux esophagitis. Patient feels much better his pain is resolved U obese treated with PPIs twice a day and have outpatient GI follow-up.             Consults  GI  Significant Tests:  See full reports for all details    Ct Abdomen Pelvis W Contrast  02/28/2015   CLINICAL DATA:  Epigastric pain, 2 days in a row. Nausea and vomiting.  EXAM: CT ABDOMEN AND PELVIS WITH CONTRAST  TECHNIQUE: Multidetector CT imaging of the abdomen and pelvis was performed using the standard protocol following bolus administration of intravenous contrast.  CONTRAST:  OMNIPAQUE IOHEXOL 300 MG/ML SOLN, 25mL OMNIPAQUE IOHEXOL 240 MG/ML SOLN  COMPARISON:  01/17/2015  FINDINGS: Lower chest:  Borderline distal esophageal wall thickening.  Hepatobiliary: Unremarkable  Pancreas: Unremarkable  Spleen: Unremarkable  Adrenals/Urinary  Tract: Unremarkable  Stomach/Bowel: Unremarkable  Vascular/Lymphatic: Unremarkable  Reproductive: Unremarkable  Other: No supplemental non-categorized findings.  Musculoskeletal: Unremarkable  IMPRESSION: 1. There is some equivocal distal esophageal wall thickening which could indicate low grade distal esophagitis, but otherwise no specific cause for the patient's epigastric pain is identified.   Electronically Signed   By: Gaylyn Rong M.D.   On: 02/28/2015 09:19   Dg Chest Portable 1 View  02/27/2015   CLINICAL DATA:  Lower abdominal pain since yesterday, smoker  EXAM: PORTABLE CHEST - 1 VIEW  COMPARISON:  Portable exam 0815 hours compared to 01/17/2015  FINDINGS: Normal heart size, mediastinal contours, and pulmonary vascularity.  Lungs clear.  No pneumothorax.  Bones unremarkable.  IMPRESSION: Normal exam.   Electronically Signed   By: Ulyses Southward M.D.   On: 02/27/2015 08:27       Today   Subjective:   Blake Kerr  feels much better his abdominal pain is resolved he is anxious to go home.  Objective:   Blood pressure 135/76, pulse 67, temperature 97.9 F (36.6 C), temperature source Oral, resp. rate 16, height  (1.854 m), weight 88.451 kg (195 lb), SpO2 99 %.  .  Intake/Output Summary (Last 24 hours) at 03/02/15 1015 Last data filed at 03/02/15 0910  Gross per 24 hour  Intake 1284.42 ml  Output    975 ml  Net 309.42 ml    Exam VITAL SIGNS: Blood pressure 135/76, pulse 67, temperature 97.9 F (36.6 C), temperature source Oral, resp. rate 16, height  (  1.854 m), weight 88.451 kg (195 lb), SpO2 99 %.  GENERAL:  39 y.o.-year-old patient lying in the bed with no acute distress.  EYES: Pupils equal, round, reactive to light and accommodation. No scleral icterus. Extraocular muscles intact.  HEENT: Head atraumatic, normocephalic. Oropharynx and nasopharynx clear.  NECK:  Supple, no jugular venous distention. No thyroid enlargement, no tenderness.  LUNGS: Normal  breath sounds bilaterally, no wheezing, rales,rhonchi or crepitation. No use of accessory muscles of respiration.  CARDIOVASCULAR: S1, S2 normal. No murmurs, rubs, or gallops.  ABDOMEN: Soft, nontender, nondistended. Bowel sounds present. No organomegaly or mass.  EXTREMITIES: No pedal edema, cyanosis, or clubbing.  NEUROLOGIC: Cranial nerves II through XII are intact. Muscle strength 5/5 in all extremities. Sensation intact. Gait not checked.  PSYCHIATRIC: The patient is alert and oriented x 3.  SKIN: No obvious rash, lesion, or ulcer.   Data Review     CBC w Diff: Lab Results  Component Value Date   WBC 7.3 03/01/2015   WBC 8.5 07/22/2012   HGB 14.0 03/01/2015   HGB 15.7 07/22/2012   HCT 41.4 03/01/2015   HCT 45.4 07/22/2012   PLT 207 03/01/2015   PLT 232 07/22/2012   LYMPHOPCT 13 01/17/2015   MONOPCT 9 01/17/2015   EOSPCT 0 01/17/2015   BASOPCT 0 01/17/2015   CMP: Lab Results  Component Value Date   NA 140 03/01/2015   NA 139 07/22/2012   K 3.8 03/01/2015   K 3.6 07/22/2012   CL 109 03/01/2015   CL 103 07/22/2012   CO2 26 03/01/2015   CO2 26 07/22/2012   BUN 13 03/01/2015   BUN 9 07/22/2012   CREATININE 0.84 03/01/2015   CREATININE 0.84 07/22/2012   PROT 8.2* 02/28/2015   PROT 7.9 07/22/2012   ALBUMIN 5.1* 02/28/2015   ALBUMIN 4.1 07/22/2012   BILITOT 0.6 02/28/2015   ALKPHOS 68 02/28/2015   ALKPHOS 100 07/22/2012   AST 33 02/28/2015   AST 21 07/22/2012   ALT 33 02/28/2015   ALT 28 07/22/2012  .  Micro Results Recent Results (from the past 240 hour(s))  C difficile quick scan w PCR reflex ([facility] only)     Status: None   Collection Time: 02/28/15  9:00 PM  Result Value Ref Range Status   C Diff antigen NEGATIVE  Final   C Diff toxin NEGATIVE  Final   C Diff interpretation Negative for C. difficile  Final  Stool culture     Status: None (Preliminary result)   Collection Time: 02/28/15  9:01 PM  Result Value Ref Range Status   Specimen Description STOOL   Final   Special Requests NONE  Final   Culture HOLDING FOR POSSIBLE PATHOGEN  Final   Report Status PENDING  Incomplete        Code Status Orders        Start     Ordered   02/28/15 1255  Full code   Continuous     02/28/15 1256          Follow-up Information    Follow up with [facility], Ezzard Standing, MD. Schedule an appointment as soon as possible for a visit in 3 weeks.   Specialty:  Internal Medicine   Contact information:   [address] [facility] [place]- [address] [address] [phone]       Discharge Medications     Medication List    STOP taking these medications  promethazine 25 MG tablet  Commonly known as:  PHENERGAN      TAKE these medications        ondansetron 4 MG tablet  Commonly known as:  ZOFRAN  Take 1 tablet (4 mg total) by mouth daily as needed for nausea or vomiting.     pantoprazole 40 MG tablet  Commonly known as:  PROTONIX  Take 1 tablet (40 mg total) by mouth 2 (two) times daily.           Total Time in preparing paper work, data evaluation and todays exam - 35 minutes  Auburn Bilberry M.D on 03/02/2015 at 10:15 AM  [facility]   Office  [phone]

## 2015-03-02 NOTE — Op Note (Signed)
EGD showed grade 2 reflux esophagitis. No active bleeding. Bx's taken. NO M-W tear. NO sequelae of chronic alcohol ingestion. Will order solids. If tolerated, ok for discharge later today on PPI bid for minimum 1 month and then daily PPI afterwards. Can f/u with [place] [facility] in few weeks. Will sign off. Pt should quit alcohol/marijuana. THanks.

## 2015-03-03 LAB — STOOL CULTURE

## 2015-03-04 LAB — SURGICAL PATHOLOGY

## 2015-03-05 LAB — O&P RESULT

## 2015-03-05 LAB — GIARDIA, EIA; OVA/PARASITE: GIARDIA AG STL: NEGATIVE

## 2015-03-10 ENCOUNTER — Encounter: Payer: Self-pay | Admitting: Gastroenterology

## 2015-04-21 ENCOUNTER — Other Ambulatory Visit: Payer: Self-pay

## 2015-04-21 ENCOUNTER — Encounter: Payer: Self-pay | Admitting: Medical Oncology

## 2015-04-21 ENCOUNTER — Emergency Department
Admission: EM | Admit: 2015-04-21 | Discharge: 2015-04-21 | Disposition: A | Discharge status: Home / Self Care | Payer: Self-pay | Attending: Emergency Medicine | Admitting: Emergency Medicine

## 2015-04-21 DIAGNOSIS — R112 Nausea with vomiting, unspecified: Secondary | ICD-10-CM | POA: Insufficient documentation

## 2015-04-21 DIAGNOSIS — Z720 Tobacco use: Secondary | ICD-10-CM | POA: Insufficient documentation

## 2015-04-21 DIAGNOSIS — R1013 Epigastric pain: Secondary | ICD-10-CM | POA: Insufficient documentation

## 2015-04-21 DIAGNOSIS — R197 Diarrhea, unspecified: Secondary | ICD-10-CM | POA: Insufficient documentation

## 2015-04-21 HISTORY — DX: Gastro-esophageal reflux disease without esophagitis: K21.9

## 2015-04-21 LAB — CBC WITH DIFFERENTIAL/PLATELET
Basophils Absolute: 0.1 10*3/uL (ref 0–0.1)
Basophils Relative: 1 %
Eosinophils Absolute: 0.1 10*3/uL (ref 0–0.7)
Eosinophils Relative: 1 %
HCT: 50.2 % (ref 40.0–52.0)
Hemoglobin: 16.9 g/dL (ref 13.0–18.0)
LYMPHS ABS: 2.9 10*3/uL (ref 1.0–3.6)
LYMPHS PCT: 26 %
MCH: 32 pg (ref 26.0–34.0)
MCHC: 33.7 g/dL (ref 32.0–36.0)
MCV: 95 fL (ref 80.0–100.0)
MONO ABS: 0.9 10*3/uL (ref 0.2–1.0)
MONOS PCT: 8 %
NEUTROS ABS: 7.2 10*3/uL — AB (ref 1.4–6.5)
NEUTROS PCT: 64 %
Platelets: 287 10*3/uL (ref 150–440)
RBC: 5.28 MIL/uL (ref 4.40–5.90)
RDW: 13.6 % (ref 11.5–14.5)
WBC: 11.2 10*3/uL — ABNORMAL HIGH (ref 3.8–10.6)

## 2015-04-21 LAB — COMPREHENSIVE METABOLIC PANEL
ALT: 24 U/L (ref 17–63)
AST: 32 U/L (ref 15–41)
Albumin: 4.9 g/dL (ref 3.5–5.0)
Alkaline Phosphatase: 81 U/L (ref 38–126)
Anion gap: 12 (ref 5–15)
BILIRUBIN TOTAL: 0.6 mg/dL (ref 0.3–1.2)
BUN: 13 mg/dL (ref 6–20)
CHLORIDE: 99 mmol/L — AB (ref 101–111)
CO2: 26 mmol/L (ref 22–32)
Calcium: 9.9 mg/dL (ref 8.9–10.3)
Creatinine, Ser: 1.06 mg/dL (ref 0.61–1.24)
GFR calc Af Amer: >60 mL/min (ref >60)
GFR calc non Af Amer: >60 mL/min (ref >60)
Glucose, Bld: 116 mg/dL — ABNORMAL HIGH (ref 65–99)
Potassium: 3.8 mmol/L (ref 3.5–5.1)
SODIUM: 137 mmol/L (ref 135–145)
TOTAL PROTEIN: 8.1 g/dL (ref 6.5–8.1)

## 2015-04-21 LAB — LIPASE, BLOOD: Lipase: 17 U/L — ABNORMAL LOW (ref 22–51)

## 2015-04-21 LAB — ETHANOL: Alcohol, Ethyl (B): 6 mg/dL — ABNORMAL HIGH (ref <5)

## 2015-04-21 MED ORDER — GI COCKTAIL ~~LOC~~
30.0000 mL | Freq: Once | ORAL | Status: AC
  Administered 2015-04-21: 30 mL via ORAL
  Filled 2015-04-21: qty 30

## 2015-04-21 MED ORDER — SUCRALFATE 1 G PO TABS: 1.0000 g | ORAL_TABLET | Freq: Four times a day (QID) | ORAL | Status: DC

## 2015-04-21 MED ORDER — HYDROMORPHONE HCL 1 MG/ML IJ SOLN
INTRAMUSCULAR | Status: AC
  Administered 2015-04-21: 1 mg via INTRAVENOUS
  Filled 2015-04-21: qty 1

## 2015-04-21 MED ORDER — ONDANSETRON HCL 4 MG/2ML IJ SOLN
INTRAMUSCULAR | Status: AC
  Administered 2015-04-21: 4 mg via INTRAVENOUS
  Filled 2015-04-21: qty 2

## 2015-04-21 MED ORDER — ONDANSETRON 4 MG PO TBDP: 4.0000 mg | ORAL_TABLET | Freq: Three times a day (TID) | ORAL | Status: DC | PRN

## 2015-04-21 MED ORDER — SODIUM CHLORIDE 0.9 % IV BOLUS (SEPSIS)
1000.0000 mL | Freq: Once | INTRAVENOUS | Status: AC
  Administered 2015-04-21: 1000 mL via INTRAVENOUS

## 2015-04-21 MED ORDER — ONDANSETRON HCL 4 MG/2ML IJ SOLN
4.0000 mg | Freq: Once | INTRAMUSCULAR | Status: AC
  Administered 2015-04-21: 4 mg via INTRAVENOUS

## 2015-04-21 MED ORDER — HYDROMORPHONE HCL 1 MG/ML IJ SOLN
1.0000 mg | Freq: Once | INTRAMUSCULAR | Status: AC
  Administered 2015-04-21: 1 mg via INTRAVENOUS

## 2015-04-21 MED ORDER — METOCLOPRAMIDE HCL 5 MG/ML IJ SOLN
10.0000 mg | Freq: Once | INTRAMUSCULAR | Status: AC
  Administered 2015-04-21: 10 mg via INTRAVENOUS
  Filled 2015-04-21: qty 2

## 2015-04-21 MED ORDER — GI COCKTAIL ~~LOC~~: 30.0000 mL | Freq: Once | ORAL | Status: DC

## 2015-04-21 MED ORDER — HYDROMORPHONE HCL 1 MG/ML IJ SOLN
1.0000 mg | Freq: Once | INTRAMUSCULAR | Status: AC
  Administered 2015-04-21: 1 mg via INTRAVENOUS
  Filled 2015-04-21: qty 1

## 2015-04-21 NOTE — ED Notes (Signed)
Family at bedside. 

## 2015-04-21 NOTE — Discharge Instructions (Signed)
Is extremely important to call GI, Dr. Bluford Kaufmann, tomorrow to schedule the first available appointment for further evaluation of your recurrent symptoms. Try using Carafate as prescribed to protect the lining of your stomach; discuss how to take it with the pharmacist as it cannot be taken at the same time as her Zantac. Return to the ER for pain that is different than her previous episodes, continued vomiting, fever or for any other concerns.   Abdominal Pain Many things can cause abdominal pain. Usually, abdominal pain is not caused by a disease and will improve without treatment. It can often be observed and treated at home. Your health care provider will do a physical exam and possibly order blood tests and X-rays to help determine the seriousness of your pain. However, in many cases, more time must pass before a clear cause of the pain can be found. Before that point, your health care provider may not know if you need more testing or further treatment. HOME CARE INSTRUCTIONS  Monitor your abdominal pain for any changes. The following actions may help to alleviate any discomfort you are experiencing:  Only take over-the-counter or prescription medicines as directed by your health care provider.  Do not take laxatives unless directed to do so by your health care provider.  Try a clear liquid diet (broth, tea, or water) as directed by your health care provider. Slowly move to a bland diet as tolerated. SEEK MEDICAL CARE IF:  You have unexplained abdominal pain.  You have abdominal pain associated with nausea or diarrhea.  You have pain when you urinate or have a bowel movement.  You experience abdominal pain that wakes you in the night.  You have abdominal pain that is worsened or improved by eating food.  You have abdominal pain that is worsened with eating fatty foods.  You have a fever. SEEK IMMEDIATE MEDICAL CARE IF:   Your pain does not go away within 2 hours.  You keep throwing up  (vomiting).  Your pain is felt only in portions of the abdomen, such as the right side or the left lower portion of the abdomen.  You pass bloody or black tarry stools. MAKE SURE YOU:  Understand these instructions.   Will watch your condition.   Will get help right away if you are not doing well or get worse.  Document Released: 06/09/2005 Document Revised: 09/04/2013 Document Reviewed: 05/09/2013 [facility] Patient Information 2015 [place], [place]. This information is not intended to replace advice given to you by your health care provider. Make sure you discuss any questions you have with your health care provider.

## 2015-04-21 NOTE — ED Notes (Signed)
Charge RN made aware that the pt only wakes up and begins moaning and groaning in agony when someone comes into the room. Otherwise the pt has regularly noted to be sleeping very soundly and comfortably between RN visits. At this last instance, RN went into d/c pt a second time when he again began crying out and moaning very loudly and forcefully (on purpose?) dry heaving. This RN left the room and stood outside the doorway; after a couple minutes, the pt again covered himself with blankets and attempted to return to sleep. This RN went into the room and explained to the pt that it had been observed that the only time he was in pain or nauseous was when it was being witnessed. Pt angrily stood up and stormed out of the room stating he would "never come to this hospital again". No nausea, wretching, groaning, or discomfort noted at the pt stomped his way to the lobby.

## 2015-04-21 NOTE — ED Notes (Signed)
Pt acting rude and demanding to see the EDP when this RN went into the pt's room to begin d/c, stating he would just be coming back again later tonight with the same s/x's. EDP and Charge RN made aware of the pt's c/o's and in to see pt. Pt Relations in to see pt regarding conflict with pt's refusal to be d/c'd. Explained to pt the requirements that need to be met for hospital admission and that it wasn't feasible for him to be admitted solely with his c/o's. Pt stated he "would just go to [facility]".

## 2015-04-21 NOTE — ED Provider Notes (Signed)
[facility] Emergency Department Provider Note ____________________________________________  Time seen: Approximately 5:29 PM  I have reviewed the triage vital signs and the nursing notes.   HISTORY  Chief Complaint Abdominal Pain and Emesis  History provided by patient, sister via phone, and sister's boyfriend who brought patient in  HPI Blake Kerr is a 39 y.o. male with past history of several episodes of acute vomiting and abdominal pain most recently evaluated within EGD June 19 showing grade 2 esophagitis who presents today with 45 minutes of symptoms similar to past episodes. He was well and then began to vomit and developed severe epigastric pain identical to prior episodes. He denies having hematemesis with this round though did have it in June. He has been taking his Zantac as prescribed. He drinks alcohol occasionally but did drink heavily in the past. He denies eating any spicy or fatty foods prior to onset. He denies ever having blood or black in his stool. No recent fever or illness. No chest pain or difficulty breathing. He becomes diaphoretic with these episodes.  Past Medical History  Diagnosis Date  . GERD (gastroesophageal reflux disease)     Patient Active Problem List   Diagnosis Date Noted  . Nausea vomiting and diarrhea 02/28/2015    Past Surgical History  Procedure Laterality Date  . Esophagogastroduodenoscopy Bilateral 03/02/2015    Procedure: ESOPHAGOGASTRODUODENOSCOPY (EGD);  Surgeon: Wallace Cullens, MD;  Location: [facility];  Service: Gastroenterology;  Laterality: Bilateral;    Current Outpatient Rx  Name  Route  Sig  Dispense  Refill  . ondansetron (ZOFRAN) 4 MG tablet   Oral   Take 1 tablet (4 mg total) by mouth daily as needed for nausea or vomiting.   20 tablet   1   . pantoprazole (PROTONIX) 40 MG tablet   Oral   Take 1 tablet (40 mg total) by mouth 2 (two) times daily.   60 tablet   2     X 6 wks then  once a day     Allergies Review of patient's allergies indicates no known allergies.  Family History  Problem Relation Age of Onset  . Hypertension Mother   . Hypertension Father   . Hyperlipidemia Father     Social History History  Substance Use Topics  . Smoking status: Current Every Day Smoker -- 0.50 packs/day    Types: Cigarettes  . Smokeless tobacco: Not on file  . Alcohol Use: 8.4 oz/week    14 Cans of beer per week    Review of Systems Constitutional: No fever Cardiovascular: Denies chest pain. Respiratory: Denies shortness of breath. Gastrointestinal: See history of present illness.   Musculoskeletal: Negative for back pain.  10-point ROS otherwise negative.  ____________________________________________   PHYSICAL EXAM:  VITAL SIGNS: ED Triage Vitals  Enc Vitals Group     BP 04/21/15 1712 163/103 mmHg     Pulse Rate 04/21/15 1712 72     Resp --      Temp 04/21/15 1719 97.9 F (36.6 C)     Temp Source 04/21/15 1712 Oral     SpO2 04/21/15 1712 100 %     Weight 04/21/15 1712 175 lb (79.379 kg)     Height 04/21/15 1712  (1.854 m)     Head Cir --      Peak Flow --      Pain Score 04/21/15 1713 10     Pain Loc --      Pain Edu? --  Excl. in GC? --    Constitutional: Alert and oriented. Retching and crying out in pain. Eyes: Conjunctivae are normal. PERRL. EOMI. Head: Atraumatic. Nose: No congestion/rhinnorhea. Mouth/Throat: Mucous membranes are dry.  Oropharynx non-erythematous. Neck: No stridor.   Lymphatic: No cervical lymphadenopathy. Cardiovascular: Normal rate, regular rhythm. Grossly normal heart sounds.  Peripheral pulses 2+ B Respiratory: Normal respiratory effort.  No retractions. Lungs CTAB. Gastrointestinal: Soft.  Mild tender epigastric. No distention. Normal bowel sounds.  Musculoskeletal: No lower extremity tenderness nor edema.  No calf TTP. Neurologic:  Normal speech and language. No gross focal neurologic deficits are  appreciated. Speech is normal.  Skin:  Skin is warm, dry and intact. No rash noted. Psychiatric: Mood and affect are normal. Speech and behavior are normal.  ____________________________________________   LABS (all labs ordered are listed, but only abnormal results are displayed)  Labs Reviewed  COMPREHENSIVE METABOLIC PANEL  ETHANOL  LIPASE, BLOOD  CBC WITH DIFFERENTIAL/PLATELET  URINALYSIS COMPLETEWITH MICROSCOPIC ([facility] ONLY)   ____________________________________________  EKG   Date: 04/21/2015  Rate: 64  Rhythm: normal sinus rhythm  QRS Axis: normal  Intervals: normal  ST/T Wave abnormalities: normal  Conduction Disutrbances: none  Narrative Interpretation: unremarkable  ____________________________________________   PROCEDURES  Procedure(s) performed: none  Critical Care performed: none ____________________________________________   INITIAL IMPRESSION / ASSESSMENT AND PLAN / ED COURSE  Pertinent labs & imaging results that were available during my care of the patient were reviewed by me and considered in my medical decision making (see chart for details).  ----------------------------------------- 7:59 PM on 04/21/2015 -----------------------------------------  Mother at bedside states this is similar to past episodes. States he has been avoiding spicy and fried foods and has been taking his medication. Has not yet followed up with Dr. Bluford Kaufmann.  Patient is receiving relief with pain medication but then pain returns and patient begins to moan again. We will repeat dosage and reassess.  ----------------------------------------- 9:28 PM on 04/21/2015 -----------------------------------------  Pain has improved and patient requesting discharge. We will reiterate following up with GI, Dr. Bluford Kaufmann. ____________________________________________   FINAL CLINICAL IMPRESSION(S) / ED DIAGNOSES  Upper abdominal pain; vomiting; recurrent episodes     Maurilio Lovely,  MD 04/21/15 2130

## 2015-04-21 NOTE — ED Notes (Signed)
Pt from home with reports that he suddenly about pta began having central abd pain with vomiting and diaphoresis.

## 2016-04-22 ENCOUNTER — Inpatient Hospital Stay
Admission: EM | Admit: 2016-04-22 | Discharge: 2016-04-24 | DRG: 392 | Disposition: A | Discharge status: Home / Self Care | Payer: Self-pay | Attending: Internal Medicine | Admitting: Internal Medicine

## 2016-04-22 ENCOUNTER — Encounter: Payer: Self-pay | Admitting: Emergency Medicine

## 2016-04-22 ENCOUNTER — Emergency Department: Payer: Self-pay

## 2016-04-22 DIAGNOSIS — Z79899 Other long term (current) drug therapy: Secondary | ICD-10-CM

## 2016-04-22 DIAGNOSIS — Z8249 Family history of ischemic heart disease and other diseases of the circulatory system: Secondary | ICD-10-CM

## 2016-04-22 DIAGNOSIS — R1032 Left lower quadrant pain: Secondary | ICD-10-CM

## 2016-04-22 DIAGNOSIS — A419 Sepsis, unspecified organism: Secondary | ICD-10-CM | POA: Diagnosis present

## 2016-04-22 DIAGNOSIS — K2900 Acute gastritis without bleeding: Principal | ICD-10-CM | POA: Diagnosis present

## 2016-04-22 DIAGNOSIS — R1112 Projectile vomiting: Secondary | ICD-10-CM

## 2016-04-22 DIAGNOSIS — F17210 Nicotine dependence, cigarettes, uncomplicated: Secondary | ICD-10-CM | POA: Diagnosis present

## 2016-04-22 DIAGNOSIS — R740 Nonspecific elevation of levels of transaminase and lactic acid dehydrogenase [LDH]: Secondary | ICD-10-CM | POA: Diagnosis present

## 2016-04-22 DIAGNOSIS — K219 Gastro-esophageal reflux disease without esophagitis: Secondary | ICD-10-CM | POA: Diagnosis present

## 2016-04-22 DIAGNOSIS — R7989 Other specified abnormal findings of blood chemistry: Secondary | ICD-10-CM

## 2016-04-22 DIAGNOSIS — D72829 Elevated white blood cell count, unspecified: Secondary | ICD-10-CM

## 2016-04-22 LAB — CBC WITH DIFFERENTIAL/PLATELET
BASOS ABS: 0.1 10*3/uL (ref 0–0.1)
BASOS PCT: 0 %
EOS ABS: 0 10*3/uL (ref 0–0.7)
Eosinophils Relative: 0 %
HCT: 48.4 % (ref 40.0–52.0)
HEMOGLOBIN: 17 g/dL (ref 13.0–18.0)
Lymphocytes Relative: 7 %
Lymphs Abs: 1.3 10*3/uL (ref 1.0–3.6)
MCH: 32.9 pg (ref 26.0–34.0)
MCHC: 35.3 g/dL (ref 32.0–36.0)
MCV: 93.3 fL (ref 80.0–100.0)
MONOS PCT: 4 %
Monocytes Absolute: 0.7 10*3/uL (ref 0.2–1.0)
NEUTROS PCT: 89 %
Neutro Abs: 16.7 10*3/uL — ABNORMAL HIGH (ref 1.4–6.5)
Platelets: 257 10*3/uL (ref 150–440)
RBC: 5.18 MIL/uL (ref 4.40–5.90)
RDW: 13.3 % (ref 11.5–14.5)
WBC: 18.8 10*3/uL — AB (ref 3.8–10.6)

## 2016-04-22 LAB — COMPREHENSIVE METABOLIC PANEL
ALT: 32 U/L (ref 17–63)
ANION GAP: 14 (ref 5–15)
AST: 34 U/L (ref 15–41)
Albumin: 5.5 g/dL — ABNORMAL HIGH (ref 3.5–5.0)
Alkaline Phosphatase: 73 U/L (ref 38–126)
BILIRUBIN TOTAL: 0.9 mg/dL (ref 0.3–1.2)
BUN: 13 mg/dL (ref 6–20)
CO2: 24 mmol/L (ref 22–32)
Calcium: 10 mg/dL (ref 8.9–10.3)
Chloride: 104 mmol/L (ref 101–111)
Creatinine, Ser: 0.85 mg/dL (ref 0.61–1.24)
Glucose, Bld: 161 mg/dL — ABNORMAL HIGH (ref 65–99)
POTASSIUM: 3.7 mmol/L (ref 3.5–5.1)
Sodium: 142 mmol/L (ref 135–145)
TOTAL PROTEIN: 8.7 g/dL — AB (ref 6.5–8.1)

## 2016-04-22 LAB — TROPONIN I

## 2016-04-22 LAB — LACTIC ACID, PLASMA: LACTIC ACID, VENOUS: 2.9 mmol/L — AB (ref 0.5–1.9)

## 2016-04-22 LAB — LIPASE, BLOOD: LIPASE: 16 U/L (ref 11–51)

## 2016-04-22 MED ORDER — HYDROMORPHONE HCL 1 MG/ML IJ SOLN
INTRAMUSCULAR | Status: AC
  Filled 2016-04-22: qty 1

## 2016-04-22 MED ORDER — HYDROMORPHONE HCL 1 MG/ML IJ SOLN
INTRAMUSCULAR | Status: AC
  Administered 2016-04-22: 0.5 mg via INTRAVENOUS
  Filled 2016-04-22: qty 1

## 2016-04-22 MED ORDER — HYDROMORPHONE HCL 1 MG/ML IJ SOLN
0.5000 mg | Freq: Once | INTRAMUSCULAR | Status: AC
  Administered 2016-04-22: 0.5 mg via INTRAVENOUS

## 2016-04-22 MED ORDER — ONDANSETRON HCL 4 MG/2ML IJ SOLN
4.0000 mg | Freq: Once | INTRAMUSCULAR | Status: AC
  Administered 2016-04-22: 4 mg via INTRAVENOUS

## 2016-04-22 MED ORDER — HYDROMORPHONE HCL 1 MG/ML IJ SOLN
0.5000 mg | Freq: Once | INTRAMUSCULAR | Status: AC
Start: 2016-04-22 — End: 2016-04-22
  Administered 2016-04-22: 0.5 mg via INTRAVENOUS

## 2016-04-22 MED ORDER — IOPAMIDOL (ISOVUE-300) INJECTION 61%
100.0000 mL | Freq: Once | INTRAVENOUS | Status: AC | PRN
  Administered 2016-04-22: 100 mL via INTRAVENOUS

## 2016-04-22 MED ORDER — PIPERACILLIN-TAZOBACTAM 3.375 G IVPB 30 MIN
3.3750 g | Freq: Once | INTRAVENOUS | Status: AC
  Administered 2016-04-23: 3.375 g via INTRAVENOUS
  Filled 2016-04-22: qty 50

## 2016-04-22 MED ORDER — SODIUM CHLORIDE 0.9 % IV SOLN
Freq: Once | INTRAVENOUS | Status: AC
  Administered 2016-04-22: 1000 mL via INTRAVENOUS

## 2016-04-22 MED ORDER — ONDANSETRON HCL 4 MG/2ML IJ SOLN
4.0000 mg | Freq: Once | INTRAMUSCULAR | Status: AC | PRN
  Administered 2016-04-22: 4 mg via INTRAVENOUS
  Filled 2016-04-22 (×3): qty 2

## 2016-04-22 MED ORDER — DIATRIZOATE MEGLUMINE & SODIUM 66-10 % PO SOLN
15.0000 mL | Freq: Once | ORAL | Status: AC
  Administered 2016-04-22: 15 mL via ORAL

## 2016-04-22 NOTE — ED Provider Notes (Signed)
[facility] Emergency Department Provider Note   ____________________________________________   First MD Initiated Contact with Patient 04/22/16 2142     (approximate)  I have reviewed the triage vital signs and the nursing notes.   HISTORY  Chief Complaint Emesis and Abdominal Pain    HPI Blake Kerr is a 40 y.o. male who reports 2 days of nausea vomiting and abdominal pain. The abdominal pain seems to be achy and possibly crampy. It is severe. Patient does not have a fever shortness of breath or any other complaints. Patient reports he's had this before and hasn't been able to be diagnosed in the past. He cannot tell me what makes it better or worse.   Past Medical History:  Diagnosis Date  . GERD (gastroesophageal reflux disease)     Patient Active Problem List   Diagnosis Date Noted  . Nausea vomiting and diarrhea 02/28/2015    Past Surgical History:  Procedure Laterality Date  . ESOPHAGOGASTRODUODENOSCOPY Bilateral 03/02/2015   Procedure: ESOPHAGOGASTRODUODENOSCOPY (EGD);  Surgeon: Wallace Cullens, MD;  Location: [facility] ENDOSCOPY;  Service: Gastroenterology;  Laterality: Bilateral;    Prior to Admission medications   Medication Sig Start Date End Date Taking? Authorizing Provider  omeprazole (PRILOSEC) 20 MG capsule Take 20 mg by mouth daily.   Yes Historical Provider, MD  ondansetron (ZOFRAN ODT) 4 MG disintegrating tablet Take 1 tablet (4 mg total) by mouth every 8 (eight) hours as needed for nausea or vomiting. Patient not taking: Reported on 04/23/2016 04/21/15   Maurilio Lovely, MD  ondansetron (ZOFRAN) 4 MG tablet Take 1 tablet (4 mg total) by mouth daily as needed for nausea or vomiting. Patient not taking: Reported on 04/21/2015 02/27/15   Jene Every, MD  pantoprazole (PROTONIX) 40 MG tablet Take 1 tablet (40 mg total) by mouth 2 (two) times daily. Patient not taking: Reported on 04/21/2015 03/02/15   Auburn Bilberry, MD  sucralfate  (CARAFATE) 1 G tablet Take 1 tablet (1 g total) by mouth 4 (four) times daily. 04/21/15 04/20/16  Maurilio Lovely, MD    Allergies Review of patient's allergies indicates no known allergies.  Family History  Problem Relation Age of Onset  . Hypertension Mother   . Hypertension Father   . Hyperlipidemia Father     Social History Social History  Substance Use Topics  . Smoking status: Current Every Day Smoker    Packs/day: 0.50    Types: Cigarettes  . Smokeless tobacco: Never Used  . Alcohol use 8.4 oz/week    14 Cans of beer per week    Review of Systems Constitutional: No fever/chills Eyes: No visual changes. ENT: No sore throat. Cardiovascular: Denies chest pain. Respiratory: Denies shortness of breath. Gastrointestinal:  abdominal pain.   nausea,vomiting.  No diarrhea.  No constipation. Genitourinary: Negative for dysuria. Musculoskeletal: Negative for back pain. Skin: Negative for rash. Neurological: Negative for headaches, focal weakness or numbness. { 10-point ROS otherwise negative.  ____________________________________________   PHYSICAL EXAM:  VITAL SIGNS: ED Triage Vitals  Enc Vitals Group     BP 04/22/16 2120 106/67     Pulse Rate 04/22/16 2120 83     Resp 04/22/16 2120 (!) 30     Temp 04/22/16 2120 98.3 F (36.8 C)     Temp Source 04/22/16 2120 Oral     SpO2 04/22/16 2120 97 %     Weight 04/22/16 2120 180 lb (81.6 kg)     Height 04/22/16 2120  (1.854 m)  Head Circumference --      Peak Flow --      Pain Score 04/22/16 2121 10     Pain Loc --      Pain Edu? --      Excl. in GC? --     Constitutional: Alert and oriented.Patient is moaning very loudly has vomited in the ER Eyes: Conjunctivae are normal. PERRL. EOMI. Head: Atraumatic. Nose: No congestion/rhinnorhea. Mouth/Throat: Mucous membranes are moist.  Oropharynx non-erythematous. Neck: No stridor. Cardiovascular: Normal rate, regular rhythm. Grossly normal heart sounds.  Good  peripheral circulation. Respiratory: Normal respiratory effort.  No retractions. Lungs CTAB. Gastrointestinal: Soft and  tender especially in the left lower quadrant to palpation and percussion No distention. No abdominal bruits. No CVA tenderness. Musculoskeletal: No lower extremity tenderness nor edema.  No joint effusions. Neurologic:  Normal speech and language. No gross focal neurologic deficits are appreciated. No gait instability. Skin:  Skin is warm, dry and intact. No rash noted.   ____________________________________________   LABS (all labs ordered are listed, but only abnormal results are displayed)  Labs Reviewed  COMPREHENSIVE METABOLIC PANEL - Abnormal; Notable for the following:       Result Value   Glucose, Bld 161 (*)    Total Protein 8.7 (*)    Albumin 5.5 (*)    All other components within normal limits  LACTIC ACID, PLASMA - Abnormal; Notable for the following:    Lactic Acid, Venous 2.9 (*)    All other components within normal limits  CBC WITH DIFFERENTIAL/PLATELET - Abnormal; Notable for the following:    WBC 18.8 (*)    Neutro Abs 16.7 (*)    All other components within normal limits  CULTURE, BLOOD (ROUTINE X 2)  CULTURE, BLOOD (ROUTINE X 2)  URINE CULTURE  LIPASE, BLOOD  TROPONIN I  URINALYSIS COMPLETEWITH MICROSCOPIC ([facility] ONLY)  LACTIC ACID, PLASMA  I-STAT CG4 LACTIC ACID, ED  I-STAT CG4 LACTIC ACID, ED   ____________________________________________  EKG EKG read and interpreted by me shows normal sinus rhythm with sinus arrhythmia at 82 slight right axis computer is reading prolonged QT interval QTC is 475 ms I do not see any acute ST-T wave changes ____________________________________________  RADIOLOGY  CLINICAL DATA:  Pt presents to ED with severe generalized abd pain and frequent vomiting for the past 2 days. Pt is moaning loudly and crying out in triage. states he needs to lie down and is resting on bench with his face.  EXAM: CT  ABDOMEN AND PELVIS WITH CONTRAST  TECHNIQUE: Multidetector CT imaging of the abdomen and pelvis was performed using the standard protocol following bolus administration of intravenous contrast.  CONTRAST:  ISOVUE-300 IOPAMIDOL (ISOVUE-300) INJECTION 61%  COMPARISON:  02/28/2015  FINDINGS: Lower chest: No pulmonary nodules, pleural effusions, or infiltrates. Heart size is normal. No imaged pericardial effusion or significant coronary artery calcifications.  Hepatobiliary: Liver is homogeneous without focal lesion. Gallbladder is present.  Pancreas: Normal in appearance.  Spleen: The stomach and small bowel loops are normal in appearance.  Renal/Adrenal: The adrenal glands are normal.  Kidneys are normal.  Gastrointestinal tract: The stomach is normal in appearance. Small bowel loops are normal in appearance. The appendix is well seen and has a normal appearance. Colonic loops are normal in caliber. There is mild fat this position within the walls, which can be associated with history of chronic inflammation. There is no evidence for active inflammation, mass, or bowel obstruction.  Reproductive/Pelvis: The urinary bladder, prostate, and  seminal vesicles are normal in appearance. No free pelvic fluid.  Vascular/Lymphatic: No evidence for aortic aneurysm. No retroperitoneal or mesenteric adenopathy.  Musculoskeletal/Abdominal wall: Visualized osseous structures have a normal appearance.  Other: none  IMPRESSION: 1. No evidence for bowel obstruction or other acute process. 2. Fat deposition within the colon which can be associated with chronic inflammation. 3. Normal appendix.   Electronically Signed   By: Norva PavlovElizabeth  Brown M.D.   On: 04/22/2016 23:20 ____________________________________________   PROCEDURES  Procedure(s) performed:  Procedures  Critical Care performed:   ____________________________________________   INITIAL  IMPRESSION / ASSESSMENT AND PLAN / ED COURSE  Pertinent labs & imaging results that were available during my care of the patient were reviewed by me and considered in my medical decision making (see chart for details).    Clinical Course     ____________________________________________   FINAL CLINICAL IMPRESSION(S) / ED DIAGNOSES  Final diagnoses:  Projectile vomiting with nausea  Left lower quadrant pain  Elevated lactic acid level  Elevated white blood cell count      NEW MEDICATIONS STARTED DURING THIS VISIT:  New Prescriptions   No medications on file     Note:  This document was prepared using Dragon voice recognition software and may include unintentional dictation errors.    Arnaldo NatalPaul F Malinda, MD 04/23/16 0030

## 2016-04-22 NOTE — ED Triage Notes (Addendum)
Pt presents to ED with severe generalized abd pain and frequent vomiting for the past 2 days. Pt is moaning loudly and crying out in triage. states he needs to lie down and is resting on bench with his face. abd pain improved when lying on stomach and when in the shower. Hx of the same with no known cause.

## 2016-04-23 DIAGNOSIS — A419 Sepsis, unspecified organism: Secondary | ICD-10-CM | POA: Diagnosis present

## 2016-04-23 LAB — URINALYSIS COMPLETE WITH MICROSCOPIC (ARMC ONLY)
Bacteria, UA: NONE SEEN
Bilirubin Urine: NEGATIVE
Glucose, UA: NEGATIVE mg/dL
Hgb urine dipstick: NEGATIVE
Leukocytes, UA: NEGATIVE
Nitrite: NEGATIVE
Protein, ur: NEGATIVE mg/dL
RBC / HPF: NONE SEEN RBC/hpf (ref 0–5)
Specific Gravity, Urine: >1.06 — ABNORMAL HIGH (ref 1.005–1.030)
pH: 8 (ref 5.0–8.0)

## 2016-04-23 LAB — HEMOGLOBIN A1C: Hgb A1c MFr Bld: 5.3 % (ref 4.0–6.0)

## 2016-04-23 LAB — LACTIC ACID, PLASMA: LACTIC ACID, VENOUS: 2 mmol/L — AB (ref 0.5–1.9)

## 2016-04-23 LAB — TSH: TSH: 0.856 u[IU]/mL (ref 0.350–4.500)

## 2016-04-23 MED ORDER — HYDROMORPHONE HCL 1 MG/ML IJ SOLN
0.5000 mg | INTRAMUSCULAR | Status: DC | PRN
  Administered 2016-04-23 (×3): 0.5 mg via INTRAVENOUS
  Filled 2016-04-23 (×3): qty 1

## 2016-04-23 MED ORDER — SODIUM CHLORIDE 0.9 % IV SOLN
INTRAVENOUS | Status: DC
  Administered 2016-04-23 (×2): via INTRAVENOUS

## 2016-04-23 MED ORDER — SODIUM CHLORIDE 0.9% FLUSH
3.0000 mL | Freq: Two times a day (BID) | INTRAVENOUS | Status: DC
  Administered 2016-04-23: 3 mL via INTRAVENOUS

## 2016-04-23 MED ORDER — DOCUSATE SODIUM 100 MG PO CAPS
100.0000 mg | ORAL_CAPSULE | Freq: Two times a day (BID) | ORAL | Status: DC
  Administered 2016-04-23 (×2): 100 mg via ORAL
  Filled 2016-04-23 (×2): qty 1

## 2016-04-23 MED ORDER — ACETAMINOPHEN 325 MG PO TABS: 650.0000 mg | ORAL_TABLET | Freq: Four times a day (QID) | ORAL | Status: DC | PRN

## 2016-04-23 MED ORDER — HYDROMORPHONE HCL 1 MG/ML IJ SOLN
0.5000 mg | Freq: Once | INTRAMUSCULAR | Status: AC
Start: 2016-04-23 — End: 2016-04-23
  Administered 2016-04-23: 0.5 mg via INTRAVENOUS
  Filled 2016-04-23: qty 1

## 2016-04-23 MED ORDER — SODIUM CHLORIDE 0.9 % IV SOLN
Freq: Once | INTRAVENOUS | Status: AC
  Administered 2016-04-23: 2000 mL via INTRAVENOUS

## 2016-04-23 MED ORDER — ACETAMINOPHEN 650 MG RE SUPP: 650.0000 mg | Freq: Four times a day (QID) | RECTAL | Status: DC | PRN

## 2016-04-23 MED ORDER — PROCHLORPERAZINE EDISYLATE 5 MG/ML IJ SOLN
10.0000 mg | INTRAMUSCULAR | Status: DC | PRN
  Administered 2016-04-23 (×2): 10 mg via INTRAVENOUS
  Filled 2016-04-23 (×3): qty 2

## 2016-04-23 MED ORDER — SUCRALFATE 1 G PO TABS
1.0000 g | ORAL_TABLET | Freq: Four times a day (QID) | ORAL | Status: DC
  Administered 2016-04-23 – 2016-04-24 (×2): 1 g via ORAL
  Filled 2016-04-23 (×3): qty 1

## 2016-04-23 MED ORDER — PROMETHAZINE HCL 25 MG/ML IJ SOLN
25.0000 mg | Freq: Once | INTRAMUSCULAR | Status: AC
  Administered 2016-04-23: 25 mg via INTRAVENOUS
  Filled 2016-04-23: qty 1

## 2016-04-23 MED ORDER — FAMOTIDINE IN NACL 20-0.9 MG/50ML-% IV SOLN
20.0000 mg | Freq: Two times a day (BID) | INTRAVENOUS | Status: DC
  Administered 2016-04-23 (×2): 20 mg via INTRAVENOUS
  Filled 2016-04-23 (×4): qty 50

## 2016-04-23 MED ORDER — PANTOPRAZOLE SODIUM 40 MG PO TBEC
40.0000 mg | DELAYED_RELEASE_TABLET | Freq: Every day | ORAL | Status: DC
  Administered 2016-04-24: 40 mg via ORAL
  Filled 2016-04-23 (×2): qty 1

## 2016-04-23 MED ORDER — ENOXAPARIN SODIUM 40 MG/0.4ML ~~LOC~~ SOLN
40.0000 mg | SUBCUTANEOUS | Status: DC
  Administered 2016-04-23: 40 mg via SUBCUTANEOUS
  Filled 2016-04-23: qty 0.4

## 2016-04-23 MED ORDER — ONDANSETRON HCL 4 MG/2ML IJ SOLN
4.0000 mg | Freq: Four times a day (QID) | INTRAMUSCULAR | Status: DC | PRN
  Administered 2016-04-23: 4 mg via INTRAVENOUS
  Filled 2016-04-23: qty 2

## 2016-04-23 MED ORDER — OXYCODONE HCL 5 MG PO TABS: 5.0000 mg | ORAL_TABLET | ORAL | Status: DC | PRN

## 2016-04-23 NOTE — Progress Notes (Signed)
Pt is awake on rounds, stated that he is feeling much better, and that he is relieved by this. Pt had no requests at this time.

## 2016-04-23 NOTE — ED Notes (Signed)
Transporting patient to room 135-1A

## 2016-04-23 NOTE — H&P (Signed)
Blake Kerr is an 40 y.o. male.   Chief Complaint: Nausea and vomiting HPI: The patient with past medical history of GERD presents emergency department complaining of intractable nausea and vomiting. This is a recurrent problem for him. He has been admitted multiple times with the same complaint and undergone workup found to be negative. He states he has vomited at least 10 times today. He denies seeing blood or bilious material. He denies ingestion of any substance or food out of the ordinary. Following multiple doses of antiemetics and pain medication in the emergency department without relief the emergency department staff called the hospitalist service for admission.  Past Medical History:  Diagnosis Date  . GERD (gastroesophageal reflux disease)     Past Surgical History:  Procedure Laterality Date  . ESOPHAGOGASTRODUODENOSCOPY Bilateral 03/02/2015   Procedure: ESOPHAGOGASTRODUODENOSCOPY (EGD);  Surgeon: Paul Y Oh, MD;  Location: [facility] ENDOSCOPY;  Service: Gastroenterology;  Laterality: Bilateral;    Family History  Problem Relation Age of Onset  . Hypertension Mother   . Hypertension Father   . Hyperlipidemia Father    Social History:  reports that he has been smoking Cigarettes.  He has been smoking about 0.50 packs per day. He has never used smokeless tobacco. He reports that he drinks about 8.4 oz of alcohol per week . He reports that he uses drugs, including Marijuana.  Allergies: No Known Allergies  Medications Prior to Admission  Medication Sig Dispense Refill  . omeprazole (PRILOSEC) 20 MG capsule Take 20 mg by mouth daily.    . ondansetron (ZOFRAN ODT) 4 MG disintegrating tablet Take 1 tablet (4 mg total) by mouth every 8 (eight) hours as needed for nausea or vomiting. (Patient not taking: Reported on 04/23/2016) 20 tablet 0  . ondansetron (ZOFRAN) 4 MG tablet Take 1 tablet (4 mg total) by mouth daily as needed for nausea or vomiting. (Patient not taking: Reported on  04/21/2015) 20 tablet 1  . pantoprazole (PROTONIX) 40 MG tablet Take 1 tablet (40 mg total) by mouth 2 (two) times daily. (Patient not taking: Reported on 04/21/2015) 60 tablet 2  . sucralfate (CARAFATE) 1 G tablet Take 1 tablet (1 g total) by mouth 4 (four) times daily. 120 tablet 1    Results for orders placed or performed during the hospital encounter of 04/22/16 (from the past 48 hour(s))  Lipase, blood     Status: None   Collection Time: 04/22/16  9:24 PM  Result Value Ref Range   Lipase 16 11 - 51 U/L  Comprehensive metabolic panel     Status: Abnormal   Collection Time: 04/22/16  9:24 PM  Result Value Ref Range   Sodium 142 135 - 145 mmol/L   Potassium 3.7 3.5 - 5.1 mmol/L   Chloride 104 101 - 111 mmol/L   CO2 24 22 - 32 mmol/L   Glucose, Bld 161 (H) 65 - 99 mg/dL   BUN 13 6 - 20 mg/dL   Creatinine, Ser 0.85 0.61 - 1.24 mg/dL   Calcium 10.0 8.9 - 10.3 mg/dL   Total Protein 8.7 (H) 6.5 - 8.1 g/dL   Albumin 5.5 (H) 3.5 - 5.0 g/dL   AST 34 15 - 41 U/L   ALT 32 17 - 63 U/L   Alkaline Phosphatase 73 38 - 126 U/L   Total Bilirubin 0.9 0.3 - 1.2 mg/dL   GFR calc non Af Amer >60 >60 mL/min   GFR calc Af Amer >60 >60 mL/min    Comment: (NOTE)   The eGFR has been calculated using the CKD EPI equation. This calculation has not been validated in all clinical situations. eGFR's persistently <60 mL/min signify possible Chronic Kidney Disease.    Anion gap 14 5 - 15  Troponin I     Status: None   Collection Time: 04/22/16  9:24 PM  Result Value Ref Range   Troponin I <0.03 <0.03 ng/mL  Lactic acid, plasma     Status: Abnormal   Collection Time: 04/22/16  9:29 PM  Result Value Ref Range   Lactic Acid, Venous 2.9 (HH) 0.5 - 1.9 mmol/L    Comment: CRITICAL RESULT CALLED TO, READ BACK BY AND VERIFIED WITH DAVID WALKER RN AT 2305 04/22/16 MSS.   CBC with Differential     Status: Abnormal   Collection Time: 04/22/16  9:29 PM  Result Value Ref Range   WBC 18.8 (H) 3.8 - 10.6 K/uL   RBC  5.18 4.40 - 5.90 MIL/uL   Hemoglobin 17.0 13.0 - 18.0 g/dL   HCT 48.4 40.0 - 52.0 %   MCV 93.3 80.0 - 100.0 fL   MCH 32.9 26.0 - 34.0 pg   MCHC 35.3 32.0 - 36.0 g/dL   RDW 13.3 11.5 - 14.5 %   Platelets 257 150 - 440 K/uL   Neutrophils Relative % 89 %   Neutro Abs 16.7 (H) 1.4 - 6.5 K/uL   Lymphocytes Relative 7 %   Lymphs Abs 1.3 1.0 - 3.6 K/uL   Monocytes Relative 4 %   Monocytes Absolute 0.7 0.2 - 1.0 K/uL   Eosinophils Relative 0 %   Eosinophils Absolute 0.0 0 - 0.7 K/uL   Basophils Relative 0 %   Basophils Absolute 0.1 0 - 0.1 K/uL  TSH     Status: None   Collection Time: 04/22/16  9:29 PM  Result Value Ref Range   TSH 0.856 0.350 - 4.500 uIU/mL  Lactic acid, plasma     Status: Abnormal   Collection Time: 04/23/16 12:06 AM  Result Value Ref Range   Lactic Acid, Venous 2.0 (HH) 0.5 - 1.9 mmol/L    Comment: CRITICAL RESULT CALLED TO, READ BACK BY AND VERIFIED WITH TRACY TOOMBS ON 04/23/16 AT 0305 QSD    Ct Abdomen Pelvis W Contrast  Result Date: 04/22/2016 CLINICAL DATA:  Pt presents to ED with severe generalized abd pain and frequent vomiting for the past 2 days. Pt is moaning loudly and crying out in triage. states he needs to lie down and is resting on bench with his face. EXAM: CT ABDOMEN AND PELVIS WITH CONTRAST TECHNIQUE: Multidetector CT imaging of the abdomen and pelvis was performed using the standard protocol following bolus administration of intravenous contrast. CONTRAST:  15m ISOVUE-300 IOPAMIDOL (ISOVUE-300) INJECTION 61% COMPARISON:  02/28/2015 FINDINGS: Lower chest: No pulmonary nodules, pleural effusions, or infiltrates. Heart size is normal. No imaged pericardial effusion or significant coronary artery calcifications. Hepatobiliary: Liver is homogeneous without focal lesion. Gallbladder is present. Pancreas: Normal in appearance. Spleen: The stomach and small bowel loops are normal in appearance. Renal/Adrenal: The adrenal glands are normal.  Kidneys are normal.  Gastrointestinal tract: The stomach is normal in appearance. Small bowel loops are normal in appearance. The appendix is well seen and has a normal appearance. Colonic loops are normal in caliber. There is mild fat this position within the walls, which can be associated with history of chronic inflammation. There is no evidence for active inflammation, mass, or bowel obstruction. Reproductive/Pelvis: The urinary bladder, prostate, and seminal  vesicles are normal in appearance. No free pelvic fluid. Vascular/Lymphatic: No evidence for aortic aneurysm. No retroperitoneal or mesenteric adenopathy. Musculoskeletal/Abdominal wall: Visualized osseous structures have a normal appearance. Other: none IMPRESSION: 1. No evidence for bowel obstruction or other acute process. 2. Fat deposition within the colon which can be associated with chronic inflammation. 3. Normal appendix. Electronically Signed   By: Nolon Nations M.D.   On: 04/22/2016 23:20    Review of Systems  Constitutional: Negative for chills and fever.  HENT: Negative for sore throat and tinnitus.   Eyes: Negative for blurred vision and redness.  Respiratory: Negative for cough and shortness of breath.   Cardiovascular: Negative for chest pain, palpitations, orthopnea and PND.  Gastrointestinal: Positive for abdominal pain, nausea and vomiting. Negative for diarrhea.  Genitourinary: Negative for dysuria, frequency and urgency.  Musculoskeletal: Negative for joint pain and myalgias.  Skin: Negative for rash.       No lesions  Neurological: Negative for speech change, focal weakness and weakness.  Endo/Heme/Allergies: Does not bruise/bleed easily.       No temperature intolerance  Psychiatric/Behavioral: Negative for depression and suicidal ideas.    Blood pressure (!) 159/99, pulse 90, temperature 98.1 F (36.7 C), temperature source Oral, resp. rate 15, height 6' 1" (1.854 m), weight 81.3 kg (179 lb 3.2 oz), SpO2 100 %. Physical Exam   Vitals reviewed. Constitutional: He is oriented to person, place, and time. He appears well-developed and well-nourished. No distress.  HENT:  Head: Normocephalic and atraumatic.  Mouth/Throat: Oropharynx is clear and moist.  Eyes: Conjunctivae and EOM are normal. Pupils are equal, round, and reactive to light. No scleral icterus.  Neck: Normal range of motion. Neck supple. No JVD present. No tracheal deviation present. No thyromegaly present.  Cardiovascular: Normal rate, regular rhythm and normal heart sounds.  Exam reveals no gallop and no friction rub.   No murmur heard. Respiratory: Effort normal and breath sounds normal. No respiratory distress.  GI: Soft. Bowel sounds are normal. He exhibits no distension. There is no tenderness.  Genitourinary:  Genitourinary Comments: Deferred  Musculoskeletal: Normal range of motion. He exhibits no edema.  Lymphadenopathy:    He has no cervical adenopathy.  Neurological: He is alert and oriented to person, place, and time. No cranial nerve deficit.  Skin: Skin is warm and dry. No rash noted. No erythema.  Psychiatric: He has a normal mood and affect. His behavior is normal. Judgment and thought content normal.     Assessment/Plan This is a 40 year old male admitted for intractable nausea and vomiting. 1. Nausea and vomiting: Intractable; obtain tox screen. I have placed patient on Compazine and we will try to manage pain as best as possible. He has had a significant GI workup in the past. For the consultation at discretion of the primary team. 2. GERD: Continue pantoprazole 3. DVT prophylaxis: Lovenox 4. GI prophylaxis: As above The patient is a full code. Time spent on admission was inpatient care approximately 45 minutes  Harrie Foreman, MD 04/23/2016, 7:34 AM

## 2016-04-23 NOTE — Progress Notes (Signed)
On rounds at 1700, pt was found sitting up in bed and consuming his clear liquid tray. Pt stated that he feels much better, has no pain and no nausea, and that this was the first time he felt like he wanted to eat in 4 days. IVF had been changed per md order, iv sites remain free of redness and swelling. Pt has remains free of falls/injury this shift, GI consult pending.

## 2016-04-23 NOTE — Plan of Care (Signed)
Problem: Bowel/Gastric: Goal: Will not experience complications related to bowel motility Outcome: Progressing Pt is awaiting GI consult. Pt able to state that he feels somewhat improved since admission.

## 2016-04-23 NOTE — ED Notes (Signed)
Patient c/o continued nausea despite constant Zofran dosing, asking MD for something else for nausea.

## 2016-04-23 NOTE — Progress Notes (Signed)
[facility] - [place] at [facility]   PATIENT NAME: Blake Kerr    MR#:  [id number]  DATE OF BIRTH:  09/03/1976  SUBJECTIVE:  Came in with intractable nausea ,vomiting and abdominal pain. Has h/o GERD No hemetemesis Feels some better  REVIEW OF SYSTEMS:   Review of Systems  Constitutional: Negative for chills, fever and weight loss.  HENT: Negative for ear discharge, ear pain and nosebleeds.   Eyes: Negative for blurred vision, pain and discharge.  Respiratory: Negative for sputum production, shortness of breath, wheezing and stridor.   Cardiovascular: Negative for chest pain, palpitations, orthopnea and PND.  Gastrointestinal: Positive for abdominal pain, heartburn, nausea and vomiting. Negative for diarrhea.  Genitourinary: Negative for frequency and urgency.  Musculoskeletal: Negative for back pain and joint pain.  Neurological: Negative for sensory change, speech change, focal weakness and weakness.  Psychiatric/Behavioral: Negative for depression and hallucinations. The patient is not nervous/anxious.    Tolerating Diet: Tolerating PT:   DRUG ALLERGIES:  No Known Allergies  VITALS:  Blood pressure (!) 109/56, pulse 71, temperature 99 F (37.2 C), temperature source Oral, resp. rate 18, height 6\' 1"  (1.854 m), weight 81.3 kg (179 lb 3.2 oz), SpO2 99 %.  PHYSICAL EXAMINATION:   Physical Exam  GENERAL:  40 y.o.-year-old patient lying in the bed with no acute distress.  EYES: Pupils equal, round, reactive to light and accommodation. No scleral icterus. Extraocular muscles intact.  HEENT: Head atraumatic, normocephalic. Oropharynx and nasopharynx clear.  NECK:  Supple, no jugular venous distention. No thyroid enlargement, no tenderness.  LUNGS: Normal breath sounds bilaterally, no wheezing, rales, rhonchi. No use of accessory muscles of respiration.  CARDIOVASCULAR: S1, S2 normal. No murmurs, rubs, or gallops.  ABDOMEN: Soft, nontender,  nondistended. Bowel sounds present. No organomegaly or mass.  EXTREMITIES: No cyanosis, clubbing or edema b/l.    NEUROLOGIC: Cranial nerves II through XII are intact. No focal Motor or sensory deficits b/l.   PSYCHIATRIC:  patient is alert and oriented x 3.  SKIN: No obvious rash, lesion, or ulcer.   LABORATORY PANEL:  CBC  Recent Labs Lab 04/22/16 2129  WBC 18.8*  HGB 17.0  HCT 48.4  PLT 257    Chemistries   Recent Labs Lab 04/22/16 2124  NA 142  K 3.7  CL 104  CO2 24  GLUCOSE 161*  BUN 13  CREATININE 0.85  CALCIUM 10.0  AST 34  ALT 32  ALKPHOS 73  BILITOT 0.9   Cardiac Enzymes  Recent Labs Lab 04/22/16 2124  TROPONINI <0.03   RADIOLOGY:  Ct Abdomen Pelvis W Contrast  Result Date: 04/22/2016 CLINICAL DATA:  Pt presents to ED with severe generalized abd pain and frequent vomiting for the past 2 days. Pt is moaning loudly and crying out in triage. states he needs to lie down and is resting on bench with his face. EXAM: CT ABDOMEN AND PELVIS WITH CONTRAST TECHNIQUE: Multidetector CT imaging of the abdomen and pelvis was performed using the standard protocol following bolus administration of intravenous contrast. CONTRAST:  100mL ISOVUE-300 IOPAMIDOL (ISOVUE-300) INJECTION 61% COMPARISON:  02/28/2015 FINDINGS: Lower chest: No pulmonary nodules, pleural effusions, or infiltrates. Heart size is normal. No imaged pericardial effusion or significant coronary artery calcifications. Hepatobiliary: Liver is homogeneous without focal lesion. Gallbladder is present. Pancreas: Normal in appearance. Spleen: The stomach and small bowel loops are normal in appearance. Renal/Adrenal: The adrenal glands are normal.  Kidneys are normal. Gastrointestinal tract: The stomach is normal in appearance.  Small bowel loops are normal in appearance. The appendix is well seen and has a normal appearance. Colonic loops are normal in caliber. There is mild fat this position within the walls, which can  be associated with history of chronic inflammation. There is no evidence for active inflammation, mass, or bowel obstruction. Reproductive/Pelvis: The urinary bladder, prostate, and seminal vesicles are normal in appearance. No free pelvic fluid. Vascular/Lymphatic: No evidence for aortic aneurysm. No retroperitoneal or mesenteric adenopathy. Musculoskeletal/Abdominal wall: Visualized osseous structures have a normal appearance. Other: none IMPRESSION: 1. No evidence for bowel obstruction or other acute process. 2. Fat deposition within the colon which can be associated with chronic inflammation. 3. Normal appendix. Electronically Signed   By: Norva Pavlov M.D.   On: 04/22/2016 23:20   ASSESSMENT AND PLAN:  40 year old male admitted for intractable nausea and vomiting. 1. Nausea and vomiting: Intractable - tox screen postive for cannabinoids, opiates and benzos  -EGD in 2016 showed gastritis IV famotidine bid -GI consult if needed -CT abd negative  2. GERD: Continue pantoprazole  3. DVT prophylaxis: Lovenox  4. GI prophylaxis: As above  5. Advised to abstain from drinking ETOH and refrain from smoking  Case discussed with Care Management/Social Worker. Management plans discussed with the patient, family and they are in agreement.  CODE STATUS: full  DVT Prophylaxis: lovenox TOTAL TIME TAKING CARE OF THIS PATIENT: 30 minutes.  >50% time spent on counselling and coordination of care pt and family  POSSIBLE D/C IN 1-2 DAYS, DEPENDING ON CLINICAL CONDITION.  Note: This dictation was prepared with Dragon dictation along with smaller phrase technology. Any transcriptional errors that result from this process are unintentional.  Demara Lover M.D on 04/23/2016 at 3:51 PM  Between 7am to 6pm - Pager - [phone]  After 6pm go to [email or web address] - password [email or web address] [facility]  [facility]  Office  [phone]  CC: Primary care physician; No PCP Per Patient

## 2016-04-23 NOTE — Care Management Note (Addendum)
Case Management Note  Patient Details  Name: Blake Kerr MRN: [id number] Date of Birth: 08/17/1976  Subjective/Objective: Spoke with patient for discharge planning. Patient is alert and oriented from home and works a full time job. He stated that his income is about $1200 monthly. Gave applications for [facility] and also for [facility].    [facility] given to patient.  Referral placed with [facility] and [facility].   Patient is independent and drives self. No other CM needs identified.                Action/Plan: Home with self care. May need help with discharge meds.  Expected Discharge Date:                  Expected Discharge Plan:  Home/Self Care  In-House Referral:     Discharge planning Services  CM Consult, Medication Assistance  Post Acute Care Choice:    Choice offered to:     DME Arranged:    DME Agency:     HH Arranged:    HH Agency:     Status of Service:  Completed, signed off  If discussed at [employer] Length of Stay Meetings, dates discussed:    Additional Comments:  Adonis HugueninBerkhead, Sherrelle Prochazka L, RN 04/23/2016, 1:36 PM

## 2016-04-23 NOTE — Progress Notes (Signed)
Pt. In great deal of pain. Dilaudid 0.5mg  extra dose.

## 2016-04-23 NOTE — Progress Notes (Signed)
Pt. Lactic acid 2.0. Dr. Sheryle Hailiamond notified, no new orders

## 2016-04-23 NOTE — Progress Notes (Signed)
Shift assessment completed at 0715. Pt easily awakened, began moaning loudly during Vs, stated he had abdominal pain rated 7/10, and then fell asleep during assessment. Abdomen is soft, bs heard. PIV#22 intact to L arm with NS infusing at 1150mls/hr, PIV#20 intact to R arm, both sites are free of redness and swelling. Meds available breifly reviewed with pt at that time.. Since assessment, family member has arrived at bedside. Pt has continued to complain about GI symptoms and has has small amount of yellowish emesis after several attempts. Pt has received zofran iv, compazine iv, and dilaudid iv to address these symptoms. Pt stating he unable to keep anything down when he eats, is resting in bed with eyes closed. Pt had to be awakend for pain med and nausea meds. Call bell in reach.

## 2016-04-24 ENCOUNTER — Encounter: Payer: Self-pay | Admitting: Emergency Medicine

## 2016-04-24 ENCOUNTER — Observation Stay
Admission: EM | Admit: 2016-04-24 | Discharge: 2016-04-25 | Disposition: A | Discharge status: Home / Self Care | Payer: Self-pay | Attending: Internal Medicine | Admitting: Internal Medicine

## 2016-04-24 DIAGNOSIS — E876 Hypokalemia: Secondary | ICD-10-CM | POA: Insufficient documentation

## 2016-04-24 DIAGNOSIS — R1110 Vomiting, unspecified: Secondary | ICD-10-CM

## 2016-04-24 DIAGNOSIS — F17210 Nicotine dependence, cigarettes, uncomplicated: Secondary | ICD-10-CM | POA: Insufficient documentation

## 2016-04-24 DIAGNOSIS — R1030 Lower abdominal pain, unspecified: Secondary | ICD-10-CM | POA: Insufficient documentation

## 2016-04-24 DIAGNOSIS — F1290 Cannabis use, unspecified, uncomplicated: Secondary | ICD-10-CM | POA: Insufficient documentation

## 2016-04-24 DIAGNOSIS — R112 Nausea with vomiting, unspecified: Principal | ICD-10-CM | POA: Insufficient documentation

## 2016-04-24 DIAGNOSIS — R1013 Epigastric pain: Secondary | ICD-10-CM | POA: Insufficient documentation

## 2016-04-24 DIAGNOSIS — R1115 Cyclical vomiting syndrome unrelated to migraine: Secondary | ICD-10-CM

## 2016-04-24 DIAGNOSIS — R1084 Generalized abdominal pain: Secondary | ICD-10-CM

## 2016-04-24 DIAGNOSIS — K219 Gastro-esophageal reflux disease without esophagitis: Secondary | ICD-10-CM | POA: Insufficient documentation

## 2016-04-24 LAB — URINE CULTURE: Culture: NO GROWTH

## 2016-04-24 LAB — COMPREHENSIVE METABOLIC PANEL
ALK PHOS: 68 U/L (ref 38–126)
ALT: 26 U/L (ref 17–63)
ANION GAP: 9 (ref 5–15)
AST: 30 U/L (ref 15–41)
Albumin: 4.6 g/dL (ref 3.5–5.0)
BILIRUBIN TOTAL: 0.8 mg/dL (ref 0.3–1.2)
BUN: 10 mg/dL (ref 6–20)
CALCIUM: 9.1 mg/dL (ref 8.9–10.3)
CO2: 26 mmol/L (ref 22–32)
Chloride: 104 mmol/L (ref 101–111)
Creatinine, Ser: 0.84 mg/dL (ref 0.61–1.24)
GFR calc non Af Amer: >60 mL/min (ref >60)
Glucose, Bld: 100 mg/dL — ABNORMAL HIGH (ref 65–99)
Potassium: 3.1 mmol/L — ABNORMAL LOW (ref 3.5–5.1)
SODIUM: 139 mmol/L (ref 135–145)
TOTAL PROTEIN: 7.4 g/dL (ref 6.5–8.1)

## 2016-04-24 LAB — CBC
HCT: 38.7 % — ABNORMAL LOW (ref 40.0–52.0)
HEMOGLOBIN: 13.8 g/dL (ref 13.0–18.0)
MCH: 33.7 pg (ref 26.0–34.0)
MCHC: 35.6 g/dL (ref 32.0–36.0)
MCV: 94.6 fL (ref 80.0–100.0)
Platelets: 186 10*3/uL (ref 150–440)
RBC: 4.09 MIL/uL — ABNORMAL LOW (ref 4.40–5.90)
RDW: 13 % (ref 11.5–14.5)
WBC: 10.2 10*3/uL (ref 3.8–10.6)

## 2016-04-24 LAB — AMYLASE: AMYLASE: 27 U/L — AB (ref 28–100)

## 2016-04-24 LAB — URINE DRUG SCREEN, QUALITATIVE (ARMC ONLY)
AMPHETAMINES, UR SCREEN: NOT DETECTED
Barbiturates, Ur Screen: NOT DETECTED
Benzodiazepine, Ur Scrn: NOT DETECTED
CANNABINOID 50 NG, UR ~~LOC~~: POSITIVE — AB
Cocaine Metabolite,Ur ~~LOC~~: NOT DETECTED
MDMA (ECSTASY) UR SCREEN: NOT DETECTED
METHADONE SCREEN, URINE: NOT DETECTED
Opiate, Ur Screen: NOT DETECTED
Phencyclidine (PCP) Ur S: NOT DETECTED
TRICYCLIC, UR SCREEN: NOT DETECTED

## 2016-04-24 LAB — CBC WITH DIFFERENTIAL/PLATELET
BASOS ABS: 0.1 10*3/uL (ref 0–0.1)
BASOS PCT: 1 %
EOS ABS: 0 10*3/uL (ref 0–0.7)
EOS PCT: 0 %
HCT: 42.4 % (ref 40.0–52.0)
Hemoglobin: 15.1 g/dL (ref 13.0–18.0)
Lymphocytes Relative: 23 %
Lymphs Abs: 2.2 10*3/uL (ref 1.0–3.6)
MCH: 33.6 pg (ref 26.0–34.0)
MCHC: 35.5 g/dL (ref 32.0–36.0)
MCV: 94.6 fL (ref 80.0–100.0)
Monocytes Absolute: 0.7 10*3/uL (ref 0.2–1.0)
Monocytes Relative: 7 %
Neutro Abs: 6.6 10*3/uL — ABNORMAL HIGH (ref 1.4–6.5)
Neutrophils Relative %: 69 %
PLATELETS: 203 10*3/uL (ref 150–440)
RBC: 4.48 MIL/uL (ref 4.40–5.90)
RDW: 13.4 % (ref 11.5–14.5)
WBC: 9.5 10*3/uL (ref 3.8–10.6)

## 2016-04-24 LAB — ETHANOL

## 2016-04-24 LAB — LACTIC ACID, PLASMA
LACTIC ACID, VENOUS: 1.3 mmol/L (ref 0.5–1.9)
Lactic Acid, Venous: 2.5 mmol/L (ref 0.5–1.9)

## 2016-04-24 LAB — LIPASE, BLOOD: LIPASE: 18 U/L (ref 11–51)

## 2016-04-24 MED ORDER — KETOROLAC TROMETHAMINE 30 MG/ML IJ SOLN
30.0000 mg | Freq: Four times a day (QID) | INTRAMUSCULAR | Status: DC | PRN
  Administered 2016-04-24: 30 mg via INTRAVENOUS
  Filled 2016-04-24: qty 1

## 2016-04-24 MED ORDER — KETOROLAC TROMETHAMINE 30 MG/ML IJ SOLN
15.0000 mg | INTRAMUSCULAR | Status: AC
  Administered 2016-04-24: 15 mg via INTRAVENOUS
  Filled 2016-04-24: qty 1

## 2016-04-24 MED ORDER — DIPHENHYDRAMINE HCL 50 MG/ML IJ SOLN
INTRAMUSCULAR | Status: AC
  Administered 2016-04-24: 50 mg via INTRAVENOUS
  Filled 2016-04-24: qty 1

## 2016-04-24 MED ORDER — ONDANSETRON HCL 4 MG/2ML IJ SOLN
4.0000 mg | Freq: Four times a day (QID) | INTRAMUSCULAR | Status: DC | PRN
  Administered 2016-04-24: 4 mg via INTRAVENOUS
  Filled 2016-04-24: qty 2

## 2016-04-24 MED ORDER — DIPHENHYDRAMINE HCL 50 MG/ML IJ SOLN
50.0000 mg | Freq: Once | INTRAMUSCULAR | Status: AC
  Administered 2016-04-24: 50 mg via INTRAVENOUS

## 2016-04-24 MED ORDER — SODIUM CHLORIDE 0.9 % IV SOLN
INTRAVENOUS | Status: DC
  Administered 2016-04-24: 19:00:00 via INTRAVENOUS

## 2016-04-24 MED ORDER — METOCLOPRAMIDE HCL 5 MG/ML IJ SOLN
INTRAMUSCULAR | Status: AC
Start: 2016-04-24 — End: 2016-04-24
  Administered 2016-04-24: 10 mg via INTRAVENOUS
  Filled 2016-04-24: qty 2

## 2016-04-24 MED ORDER — ONDANSETRON HCL 4 MG/2ML IJ SOLN
4.0000 mg | Freq: Once | INTRAMUSCULAR | Status: AC
  Administered 2016-04-24: 4 mg via INTRAVENOUS
  Filled 2016-04-24: qty 2

## 2016-04-24 MED ORDER — MORPHINE SULFATE (PF) 2 MG/ML IV SOLN
1.0000 mg | INTRAVENOUS | Status: DC | PRN
  Administered 2016-04-24: 1 mg via INTRAVENOUS
  Filled 2016-04-24: qty 1

## 2016-04-24 MED ORDER — ONDANSETRON HCL 4 MG PO TABS: 4.0000 mg | ORAL_TABLET | Freq: Four times a day (QID) | ORAL | Status: DC | PRN

## 2016-04-24 MED ORDER — SODIUM CHLORIDE 0.9 % IV BOLUS (SEPSIS)
1000.0000 mL | Freq: Once | INTRAVENOUS | Status: AC
  Administered 2016-04-24: 1000 mL via INTRAVENOUS

## 2016-04-24 MED ORDER — OMEPRAZOLE 20 MG PO CPDR: 20.0000 mg | DELAYED_RELEASE_CAPSULE | Freq: Two times a day (BID) | ORAL | 1 refills | Status: DC

## 2016-04-24 MED ORDER — ACETAMINOPHEN 325 MG PO TABS: 650.0000 mg | ORAL_TABLET | Freq: Four times a day (QID) | ORAL | Status: DC | PRN

## 2016-04-24 MED ORDER — FAMOTIDINE IN NACL 20-0.9 MG/50ML-% IV SOLN
20.0000 mg | Freq: Two times a day (BID) | INTRAVENOUS | Status: DC
  Administered 2016-04-24 – 2016-04-25 (×2): 20 mg via INTRAVENOUS
  Filled 2016-04-24 (×4): qty 50

## 2016-04-24 MED ORDER — DICYCLOMINE HCL 10 MG/ML IM SOLN
20.0000 mg | Freq: Once | INTRAMUSCULAR | Status: AC
  Administered 2016-04-24: 20 mg via INTRAMUSCULAR
  Filled 2016-04-24: qty 2

## 2016-04-24 MED ORDER — FAMOTIDINE IN NACL 20-0.9 MG/50ML-% IV SOLN
20.0000 mg | Freq: Once | INTRAVENOUS | Status: AC
  Administered 2016-04-24: 20 mg via INTRAVENOUS
  Filled 2016-04-24: qty 50

## 2016-04-24 MED ORDER — HALOPERIDOL LACTATE 5 MG/ML IJ SOLN
5.0000 mg | Freq: Once | INTRAMUSCULAR | Status: AC
  Administered 2016-04-24: 5 mg via INTRAVENOUS

## 2016-04-24 MED ORDER — HALOPERIDOL LACTATE 5 MG/ML IJ SOLN
INTRAMUSCULAR | Status: AC
  Administered 2016-04-24: 5 mg via INTRAVENOUS
  Filled 2016-04-24: qty 1

## 2016-04-24 MED ORDER — METOCLOPRAMIDE HCL 5 MG/ML IJ SOLN
10.0000 mg | Freq: Once | INTRAMUSCULAR | Status: AC
Start: 2016-04-24 — End: 2016-04-24
  Administered 2016-04-24: 10 mg via INTRAVENOUS

## 2016-04-24 MED ORDER — ACETAMINOPHEN 650 MG RE SUPP: 650.0000 mg | Freq: Four times a day (QID) | RECTAL | Status: DC | PRN

## 2016-04-24 MED ORDER — POTASSIUM CHLORIDE 10 MEQ/100ML IV SOLN
10.0000 meq | INTRAVENOUS | Status: AC
  Administered 2016-04-24 (×2): 10 meq via INTRAVENOUS
  Filled 2016-04-24 (×3): qty 100

## 2016-04-24 MED ORDER — MORPHINE SULFATE (PF) 2 MG/ML IV SOLN
2.0000 mg | INTRAVENOUS | Status: DC | PRN
  Administered 2016-04-24: 2 mg via INTRAVENOUS
  Filled 2016-04-24: qty 1

## 2016-04-24 NOTE — ED Notes (Signed)
Pt remains to c/o nausea. Dr Scotty Courtstafford notified. Only mucous noted in bag.

## 2016-04-24 NOTE — H&P (Signed)
[facility] - [place] @ [facility] Admission History and Physical [employer] Nickole Adamek, D.O.  ---------------------------------------------------------------------------------------------------------------------   PATIENT NAME: Blake Kerr MR#: [id number] DATE OF BIRTH: 06/08/1976 DATE OF ADMISSION: 04/24/2016 PRIMARY CARE PHYSICIAN: No PCP Per Patient  REQUESTING/REFERRING PHYSICIAN: ED Dr. Inocencio HomesGayle  CHIEF COMPLAINT: Chief Complaint  Patient presents with  . Abdominal Pain    HISTORY OF PRESENT ILLNESS: Blake Kerr is a 40 y.o. male with a known history of recurrent abdominal pain of unclear etiology was discharged from the hospital this afternoon following admission for intractable abdominal pain, nausea and vomiting.  Patient was found to be well this morning on rounds.  States that he ate breakfast, went home and within 45 minutes he had 10/10 midepigastric and lower abdominal pain associated with intractable nausea, vomiting, dry heaving.  His girlfriend states that he has had similar symptoms over the past year including [facility] here [facility] but "no one can tell us why this is happening."  Reportedly saw GI who recommended endoscopy and colonoscopy.  Last endoscopy was one year ago.    Otherwise there has been no change in status. Tthere has been no other recent change in medication or diet.  There has been no recent illness, travel or sick contacts.    Patient denies fevers/chills, weakness, dizziness, chest pain, shortness of breath, N/V/C/D, abdominal pain, dysuria/frequency, changes in mental status.   EMS/ED COURSE:   Patient receiving Haldol, Benadryl, Zofran for his symptoms without relief.  Continues to wretch and writhe in pain.   PAST MEDICAL HISTORY: Past Medical History:  Diagnosis Date  . GERD (gastroesophageal reflux disease)       PAST SURGICAL HISTORY: Past Surgical History:  Procedure Laterality Date  . ESOPHAGOGASTRODUODENOSCOPY Bilateral  03/02/2015   Procedure: ESOPHAGOGASTRODUODENOSCOPY (EGD);  Surgeon: Wallace CullensPaul Y Oh, MD;  [facility];  Service: Gastroenterology;  Laterality: Bilateral;      SOCIAL HISTORY: Social History  Substance Use Topics  . Smoking status: Current Every Day Smoker    Packs/day: 0.50    Types: Cigarettes  . Smokeless tobacco: Never Used  . Alcohol use 8.4 oz/week    14 Cans of beer per week  Admits to smoking marijuana "occasionally" and understands [facility] these [facility].     FAMILY HISTORY: Family History  Problem Relation Age of Onset  . Hypertension Mother   . Hypertension Father   . Hyperlipidemia Father      MEDICATIONS AT HOME: Prior to Admission medications   Medication Sig Start Date End Date Taking? Authorizing Provider  omeprazole (PRILOSEC) 20 MG capsule Take 1 capsule (20 mg total) by mouth 2 (two) times daily before a meal. 04/24/16  Yes Enedina FinnerSona Patel, MD  ondansetron (ZOFRAN ODT) 4 MG disintegrating tablet Take 1 tablet (4 mg total) by mouth every 8 (eight) hours as needed for nausea or vomiting. Patient not taking: Reported on 04/23/2016 04/21/15   Maurilio LovelyNoelle McLaurin, MD  sucralfate (CARAFATE) 1 G tablet Take 1 tablet (1 g total) by mouth 4 (four) times daily. 04/21/15 04/20/16  Maurilio LovelyNoelle McLaurin, MD      DRUG ALLERGIES: No Known Allergies   REVIEW OF SYSTEMS: CONSTITUTIONAL: No fever/chills, fatigue, weakness, weight gain/loss, headache EYES: No blurry or double vision. ENT: No tinnitus, postnasal drip, redness or soreness of the oropharynx. RESPIRATORY: No cough, wheeze, hemoptysis, dyspnea. CARDIOVASCULAR: No chest pain, orthopnea, palpitations, syncope. GASTROINTESTINAL: (+) nausea, vomiting, diarrhea, abdominal pain. NO hematemesis, melena or hematochezia. GENITOURINARY: No dysuria or hematuria. ENDOCRINE: No polyuria  or nocturia. No heat or cold intolerance. HEMATOLOGY: No anemia, bruising, bleeding. INTEGUMENTARY: No rashes, ulcers,  lesions. MUSCULOSKELETAL: No arthritis, swelling, gout. NEUROLOGIC: No numbness, tingling, weakness or ataxia. No seizure-type activity. PSYCHIATRIC: No anxiety, depression, insomnia.  PHYSICAL EXAMINATION: VITAL SIGNS: Blood pressure (!) 155/90, pulse 72, temperature 98.7 F (37.1 C), temperature source Oral, resp. rate 16, height 6\' 1"  (1.854 m), weight 81.6 kg (180 lb), SpO2 100 %.  GENERAL: 40 y.o.-year-old white male patient, well-developed, well-nourished lying in the bed in moderate distress.  Writhing in pain, asking for pain medication stating "Dilaudid is the only thing that helps." HEENT: Head atraumatic, normocephalic. Pupils equal, round, reactive to light and accommodation. No scleral icterus. Extraocular muscles intact. Nares are patent. Oropharynx is clear. Mucus membranes moist. NECK: Supple, full range of motion. No JVD, no bruit heard. No thyroid enlargement, no tenderness, no cervical lymphadenopathy. CHEST: Normal breath sounds bilaterally. No wheezing, rales, rhonchi or crackles. No use of accessory muscles of respiration.  No reproducible chest wall tenderness.  CARDIOVASCULAR: S1, S2 normal. No murmurs, rubs, or gallops. Cap refill <2 seconds. ABDOMEN: Nondistended. Normoactive bowel sounds present in all four quadrants.  Voluntary guarding, patient will not allow me to palpate his abdomen indicating the pain is too severe.   EXTREMITIES: Full range of motion. No pedal edema, cyanosis, or clubbing. NEUROLOGIC: Cranial nerves II through XII are grossly intact with no focal sensorimotor deficit. Muscle strength 5/5 in all extremities. Sensation intact. Gait not checked. PSYCHIATRIC: The patient is alert and oriented x 3. Normal affect, mood, thought content. SKIN: Warm, dry, and intact without obvious rash, lesion, or ulcer.  LABORATORY PANEL:  CBC  Recent Labs Lab 04/24/16 1200  WBC 9.5  HGB 15.1  HCT 42.4  PLT 203    ----------------------------------------------------------------------------------------------------------------- Chemistries  Recent Labs Lab 04/24/16 1200  NA 139  K 3.1*  CL 104  CO2 26  GLUCOSE 100*  BUN 10  CREATININE 0.84  CALCIUM 9.1  AST 30  ALT 26  ALKPHOS 68  BILITOT 0.8   ------------------------------------------------------------------------------------------------------------------ Cardiac Enzymes  Recent Labs Lab 04/22/16 2124  TROPONINI <0.03   ------------------------------------------------------------------------------------------------------------------  RADIOLOGY: Ct Abdomen Pelvis W Contrast  Result Date: 04/22/2016 CLINICAL DATA:  Pt presents to ED with severe generalized abd pain and frequent vomiting for the past 2 days. Pt is moaning loudly and crying out in triage. states he needs to lie down and is resting on bench with his face. EXAM: CT ABDOMEN AND PELVIS WITH CONTRAST TECHNIQUE: Multidetector CT imaging of the abdomen and pelvis was performed using the standard protocol following bolus administration of intravenous contrast. CONTRAST:  ISOVUE-300 IOPAMIDOL (ISOVUE-300) INJECTION 61% COMPARISON:  02/28/2015 FINDINGS: Lower chest: No pulmonary nodules, pleural effusions, or infiltrates. Heart size is normal. No imaged pericardial effusion or significant coronary artery calcifications. Hepatobiliary: Liver is homogeneous without focal lesion. Gallbladder is present. Pancreas: Normal in appearance. Spleen: The stomach and small bowel loops are normal in appearance. Renal/Adrenal: The adrenal glands are normal.  Kidneys are normal. Gastrointestinal tract: The stomach is normal in appearance. Small bowel loops are normal in appearance. The appendix is well seen and has a normal appearance. Colonic loops are normal in caliber. There is mild fat this position within the walls, which can be associated with history of chronic inflammation. There is no  evidence for active inflammation, mass, or bowel obstruction. Reproductive/Pelvis: The urinary bladder, prostate, and seminal vesicles are normal in appearance. No free pelvic fluid. Vascular/Lymphatic: No evidence for aortic  aneurysm. No retroperitoneal or mesenteric adenopathy. Musculoskeletal/Abdominal wall: Visualized osseous structures have a normal appearance. Other: none IMPRESSION: 1. No evidence for bowel obstruction or other acute process. 2. Fat deposition within the colon which can be associated with chronic inflammation. 3. Normal appendix. Electronically Signed   By: Norva Pavlov M.D.   On: 04/22/2016 23:20   IMPRESSION AND PLAN:  This is a 40 y.o. male with a history of GERD, recent admission for intractable nausea/vomiting/abdominal pain now being admitted with: 1. Recurrent, intractable abdominal pain of unclear etiology with nausea/vomiting - ddx includes gastritis, gastroparesis, opioid withdrawal, infection.  GI recommended colonoscopy, consider repeat endoscopy.  - Admit to med-surg for symptom control.  NPO, pain control, anti-emetics, IVFs, PPI.  Check stool studies to rule out infectious diarrhea.  Check lactate.  Consider repeat abdominal imaging.  - Concern for drug-seeking behaviors - will check utox (patient adamantly denies drug use other than occasional marijuana).  - Further inpatient workup at the discretion of rounding provider.  2. Hypokalemia - will replace IV.    Activity: Ad lib Diet/Nutrition: NPO Fluids: IVNS DVT Px: SCDs and early ambulation  All the records are reviewed and case discussed with ED provider. Management plans discussed with the patient and/or family who express understanding and agree with plan of care.  CODE STATUS: Full TOTAL TIME TAKING CARE OF THIS PATIENT: 55 minutes.   Tu Shimmel D.O. on 04/24/2016 at 5:23 PM Between 7am to 6pm - Pager - [phone] After 6pm go to [email or web address] - Biomedical engineer  [facility] Office [phone] CC: Primary care physician; No PCP Per Patient     Note: This dictation was prepared with Dragon dictation along with smaller phrase technology. Any transcriptional errors that result from this process are unintentional.

## 2016-04-24 NOTE — Discharge Instructions (Signed)
Get PCP in the area Stop drinking alcohol and stop smoking

## 2016-04-24 NOTE — ED Notes (Signed)
Pt continues to c/o abdominal cramping. Pt states "whatever they were giving me yesterday worked, why isn't it working today" Pt states he was given Zofran and Dilaudid yesterday. EDP already stated they would not give pt Dilaudid, but has several doses of Zofran, as well as received Toradol, Bentyl, Reglan, but states nothing is working. Pt continues to yell out in pain.  Pt has had some dry heaves along with spitting up some bile, but no active vomiting since the start of my shift.

## 2016-04-24 NOTE — Discharge Summary (Signed)
[facility] - [place] at [facility]   PATIENT NAME: Blake Kerr    MR#:  [id number]  DATE OF BIRTH:  23-Aug-1976  DATE OF ADMISSION:  04/22/2016 ADMITTING PHYSICIAN: Arnaldo Natal, MD  DATE OF DISCHARGE: 04/24/16  PRIMARY CARE PHYSICIAN: No PCP Per Patient    ADMISSION DIAGNOSIS:  Left lower quadrant pain [R10.32] Elevated white blood cell count [D72.829] Elevated lactic acid level [E87.2] Projectile vomiting with nausea [R11.12]  DISCHARGE DIAGNOSIS:  Intractable vomiting due to Acute gastritis H/o GERD SECONDARY DIAGNOSIS:   Past Medical History:  Diagnosis Date  . GERD (gastroesophageal reflux disease)     HOSPITAL COURSE:   40 year old male admitted for intractable nausea and vomiting. 1. Nausea and vomiting: Intractable - tox screen postive for cannabinoids, opiates and benzos  -EGD in 2016 showed gastritis IV famotidine bid---change to po prilosec bid -GI consult if needed -CT abd negative -soft diet today   2. GERD: Continue ppi  3. DVT prophylaxis: Lovenox  4. GI prophylaxis: As above  5. Advised to abstain from drinking ETOH and refrain from smoking  Soft diet today. D/chome later today  CONSULTS OBTAINED:  Treatment Team:  Lacey Jensen, MD  DRUG ALLERGIES:  No Known Allergies  DISCHARGE MEDICATIONS:   Current Discharge Medication List    CONTINUE these medications which have CHANGED   Details  omeprazole (PRILOSEC) 20 MG capsule Take 1 capsule (20 mg total) by mouth 2 (two) times daily before a meal. Qty: 60 capsule, Refills: 1      CONTINUE these medications which have NOT CHANGED   Details  ondansetron (ZOFRAN ODT) 4 MG disintegrating tablet Take 1 tablet (4 mg total) by mouth every 8 (eight) hours as needed for nausea or vomiting. Qty: 20 tablet, Refills: 0    sucralfate (CARAFATE) 1 G tablet Take 1 tablet (1 g total) by mouth 4 (four) times daily. Qty: 120 tablet, Refills: 1      STOP taking  these medications     ondansetron (ZOFRAN) 4 MG tablet      pantoprazole (PROTONIX) 40 MG tablet         If you experience worsening of your admission symptoms, develop shortness of breath, life threatening emergency, suicidal or homicidal thoughts you must seek medical attention immediately by calling 911 or calling your MD immediately  if symptoms less severe.  You Must read complete instructions/literature along with all the possible adverse reactions/side effects for all the Medicines you take and that have been prescribed to you. Take any new Medicines after you have completely understood and accept all the possible adverse reactions/side effects.   Please note  You were cared for by a hospitalist during your hospital stay. If you have any questions about your discharge medications or the care you received while you were in the hospital after you are discharged, you can call the unit and asked to speak with the hospitalist on call if the hospitalist that took care of you is not available. Once you are discharged, your primary care physician will handle any further medical issues. Please note that NO REFILLS for any discharge medications will be authorized once you are discharged, as it is imperative that you return to your primary care physician (or establish a relationship with a primary care physician if you do not have one) for your aftercare needs so that they can reassess your need for medications and monitor your lab values. Today   SUBJECTIVE   Feels a whole lot  better  VITAL SIGNS:  Blood pressure 130/84, pulse (!) 56, temperature 97.5 F (36.4 C), temperature source Oral, resp. rate 16, height 6\' 1"  (1.854 m), weight 81.3 kg (179 lb 3.2 oz), SpO2 98 %.  I/O:   Intake/Output Summary (Last 24 hours) at 04/24/16 0823 Last data filed at 04/24/16 0653  Gross per 24 hour  Intake          3473.75 ml  Output                0 ml  Net          3473.75 ml    PHYSICAL  EXAMINATION:  GENERAL:  40 y.o.-year-old patient lying in the bed with no acute distress.  EYES: Pupils equal, round, reactive to light and accommodation. No scleral icterus. Extraocular muscles intact.  HEENT: Head atraumatic, normocephalic. Oropharynx and nasopharynx clear.  NECK:  Supple, no jugular venous distention. No thyroid enlargement, no tenderness.  LUNGS: Normal breath sounds bilaterally, no wheezing, rales,rhonchi or crepitation. No use of accessory muscles of respiration.  CARDIOVASCULAR: S1, S2 normal. No murmurs, rubs, or gallops.  ABDOMEN: Soft, non-tender, non-distended. Bowel sounds present. No organomegaly or mass.  EXTREMITIES: No pedal edema, cyanosis, or clubbing.  NEUROLOGIC: Cranial nerves II through XII are intact. Muscle strength 5/5 in all extremities. Sensation intact. Gait not checked.  PSYCHIATRIC: The patient is alert and oriented x 3.  SKIN: No obvious rash, lesion, or ulcer.   DATA REVIEW:   CBC   Recent Labs Lab 04/24/16 0343  WBC 10.2  HGB 13.8  HCT 38.7*  PLT 186    Chemistries   Recent Labs Lab 04/22/16 2124  NA 142  K 3.7  CL 104  CO2 24  GLUCOSE 161*  BUN 13  CREATININE 0.85  CALCIUM 10.0  AST 34  ALT 32  ALKPHOS 73  BILITOT 0.9    Microbiology Results   Recent Results (from the past 240 hour(s))  Blood Culture (routine x 2)     Status: None (Preliminary result)   Collection Time: 04/23/16  1:34 AM  Result Value Ref Range Status   Specimen Description BLOOD LEFT ASSIST CONTROL  Final   Special Requests BOTTLES DRAWN AEROBIC AND ANAEROBIC 8CC  Final   Culture NO GROWTH 1 DAY  Final   Report Status PENDING  Incomplete  Blood Culture (routine x 2)     Status: None (Preliminary result)   Collection Time: 04/23/16  1:34 AM  Result Value Ref Range Status   Specimen Description BLOOD RIGHT ASSIST CONTROL  Final   Special Requests   Final    BOTTLES DRAWN AEROBIC AND ANAEROBIC 7CC AERO 8CC ANA   Culture NO GROWTH 1 DAY   Final   Report Status PENDING  Incomplete    RADIOLOGY:  Ct Abdomen Pelvis W Contrast  Result Date: 04/22/2016 CLINICAL DATA:  Pt presents to ED with severe generalized abd pain and frequent vomiting for the past 2 days. Pt is moaning loudly and crying out in triage. states he needs to lie down and is resting on bench with his face. EXAM: CT ABDOMEN AND PELVIS WITH CONTRAST TECHNIQUE: Multidetector CT imaging of the abdomen and pelvis was performed using the standard protocol following bolus administration of intravenous contrast. CONTRAST:  100mL ISOVUE-300 IOPAMIDOL (ISOVUE-300) INJECTION 61% COMPARISON:  02/28/2015 FINDINGS: Lower chest: No pulmonary nodules, pleural effusions, or infiltrates. Heart size is normal. No imaged pericardial effusion or significant coronary artery calcifications. Hepatobiliary: Liver is homogeneous without  focal lesion. Gallbladder is present. Pancreas: Normal in appearance. Spleen: The stomach and small bowel loops are normal in appearance. Renal/Adrenal: The adrenal glands are normal.  Kidneys are normal. Gastrointestinal tract: The stomach is normal in appearance. Small bowel loops are normal in appearance. The appendix is well seen and has a normal appearance. Colonic loops are normal in caliber. There is mild fat this position within the walls, which can be associated with history of chronic inflammation. There is no evidence for active inflammation, mass, or bowel obstruction. Reproductive/Pelvis: The urinary bladder, prostate, and seminal vesicles are normal in appearance. No free pelvic fluid. Vascular/Lymphatic: No evidence for aortic aneurysm. No retroperitoneal or mesenteric adenopathy. Musculoskeletal/Abdominal wall: Visualized osseous structures have a normal appearance. Other: none IMPRESSION: 1. No evidence for bowel obstruction or other acute process. 2. Fat deposition within the colon which can be associated with chronic inflammation. 3. Normal appendix.  Electronically Signed   By: Norva Pavlov M.D.   On: 04/22/2016 23:20     Management plans discussed with the patient, family and they are in agreement.  CODE STATUS:     Code Status Orders        Start     Ordered   04/23/16 0205  Full code  Continuous     04/23/16 0204    Code Status History    Date Active Date Inactive Code Status Order ID Comments User Context   02/28/2015 12:56 PM 03/02/2015  2:07 PM Full Code [id number]  Alford Highland, MD ED      TOTAL TIME TAKING CARE OF THIS PATIENT: 40 minutes.    Herschel Fleagle M.D on 04/24/2016 at 8:23 AM  Between 7am to 6pm - Pager - [phone] After 6pm go to [email or web address] - password [email or web address] [facility]  [facility] [place] [facility]  Office  [phone]  CC: Primary care physician; No PCP Per Patient

## 2016-04-24 NOTE — ED Notes (Signed)
Pt hands contracted. Having dystonic type reaction from haldol. Dr Scotty Courtstafford notified.

## 2016-04-24 NOTE — Plan of Care (Signed)
Pts lactic acid 2.5. Prime doc paged. MD Hower returned paged, and notified. No new orders at this time.

## 2016-04-24 NOTE — ED Notes (Signed)
Pt given gingerale for PO challenge 

## 2016-04-24 NOTE — ED Notes (Signed)
Pt remains to c/o nausea. Dr Scotty Courtstafford notified.

## 2016-04-24 NOTE — ED Triage Notes (Signed)
Just discharged from floor, returns to ER due to severe abdominal pain.

## 2016-04-24 NOTE — ED Provider Notes (Signed)
-----------------------------------------   4:27 PM on 04/24/2016 -----------------------------------------  I received care of this patient from Dr. Scotty CourtStafford at 3:00 pending reassessment. After multiple rounds of nausea medicines, he continues to dry heave, he continues to have abdominal pain. Therefore, I discussed the case with the hospitalist for readmission for intractable abdominal pain, nausea and vomiting.   Gayla DossEryka A Kadience Macchi, MD 04/24/16 [id number]

## 2016-04-24 NOTE — ED Provider Notes (Signed)
[facility] Emergency Department Provider Note  ____________________________________________  Time seen: Approximately 12:34 PM  I have reviewed the triage vital signs and the nursing notes.   HISTORY  Chief Complaint Abdominal Pain    HPI Blake Kerr is a 40 y.o. male brought to the ED complaining of generalized abdominal pain with nausea and vomiting that started about an hour ago.He was recently hospitalized for the past 2 days due to intractable nausea vomiting with leukocytosis and a initially elevated lactate. His labs seem to return to normal. His symptoms had resolved, he been evaluated by GI in the hospital and recommended for outpatient colonoscopy, and he ate breakfast in the hospital prior to discharge home. He then went home and about an hour later had return of his severe abdominal pain and vomiting symptoms. Pain is nonradiating. No aggravating or alleviating factors. No chest pain shortness of breath fevers or chills. No dizziness or syncope. No back pain. No dysuria     Past Medical History:  Diagnosis Date  . GERD (gastroesophageal reflux disease)      Patient Active Problem List   Diagnosis Date Noted  . Sepsis (HCC) 04/23/2016  . Nausea vomiting and diarrhea 02/28/2015     Past Surgical History:  Procedure Laterality Date  . ESOPHAGOGASTRODUODENOSCOPY Bilateral 03/02/2015   Procedure: ESOPHAGOGASTRODUODENOSCOPY (EGD);  Surgeon: Wallace CullensPaul Y Oh, MD;  [facility] END[facility];  Service: Gastroenterology;  Laterality: Bilateral;     Prior to Admission medications   Medication Sig Start Date End Date Taking? Authorizing Provider  omeprazole (PRILOSEC) 20 MG capsule Take 1 capsule (20 mg total) by mouth 2 (two) times daily before a meal. 04/24/16  Yes Enedina FinnerSona Patel, MD  ondansetron (ZOFRAN ODT) 4 MG disintegrating tablet Take 1 tablet (4 mg total) by mouth every 8 (eight) hours as needed for nausea or vomiting. Patient not taking:  Reported on 04/23/2016 04/21/15   Maurilio LovelyNoelle McLaurin, MD  sucralfate (CARAFATE) 1 G tablet Take 1 tablet (1 g total) by mouth 4 (four) times daily. 04/21/15 04/20/16  Maurilio LovelyNoelle McLaurin, MD     Allergies Review of patient's allergies indicates no known allergies.   Family History  Problem Relation Age of Onset  . Hypertension Mother   . Hypertension Father   . Hyperlipidemia Father     Social History Social History  Substance Use Topics  . Smoking status: Current Every Day Smoker    Packs/day: 0.50    Types: Cigarettes  . Smokeless tobacco: Never Used  . Alcohol use 8.4 oz/week    14 Cans of beer per week    Review of Systems  Constitutional:   No fever or chills.  ENT:   No sore throat. No rhinorrhea. Cardiovascular:   No chest pain. Respiratory:   No dyspnea or cough. Gastrointestinal:   Positive severe abdominal pain.  Genitourinary:   Negative for dysuria or difficulty urinating. Musculoskeletal:   Negative for focal pain or swelling Neurological:   Negative for headaches 10-point ROS otherwise negative.  ____________________________________________   PHYSICAL EXAM:  VITAL SIGNS: ED Triage Vitals  Enc Vitals Group     BP 04/24/16 1151 (!) 143/88     Pulse Rate 04/24/16 1151 82     Resp 04/24/16 1151 18     Temp 04/24/16 1151 98.7 F (37.1 C)     Temp Source 04/24/16 1151 Oral     SpO2 04/24/16 1151 98 %     Weight 04/24/16 1152 180 lb (81.6 kg)  Height 04/24/16 1152  (1.854 m)     Head Circumference --      Peak Flow --      Pain Score 04/24/16 1152 10     Pain Loc --      Pain Edu? --      Excl. in GC? --     Vital signs reviewed, nursing assessments reviewed.   Constitutional:   Alert and oriented. Uncomfortable, screaming due to pain. Dry heaving.. Eyes:   No scleral icterus. No conjunctival pallor. PERRL. EOMI.  No nystagmus. ENT   Head:   Normocephalic and atraumatic.   Nose:   No congestion/rhinnorhea. No septal hematoma    Mouth/Throat:   MMM, no pharyngeal erythema. No peritonsillar mass.    Neck:   No stridor. No SubQ emphysema. No meningismus. Hematological/Lymphatic/Immunilogical:   No cervical lymphadenopathy. Cardiovascular:   RRR. Symmetric bilateral radial and DP pulses.  No murmurs.  Respiratory:   Normal respiratory effort without tachypnea nor retractions. Breath sounds are clear and equal bilaterally. No wheezes/rales/rhonchi. Gastrointestinal:  Unable to obtain reliable exam due to patient frequently yelling and tensing his abdominal wall. No focal tenderness appreciated on exam.. Non distended. There is no CVA tenderness.  No rebound, rigidity, or guarding. Genitourinary:   deferred Musculoskeletal:   Nontender with normal range of motion in all extremities. No joint effusions.  No lower extremity tenderness.  No edema. Neurologic:   Normal speech and language.  CN 2-10 normal. Motor grossly intact. Normal tone Reflexes intact and symmetric. No gross focal neurologic deficits are appreciated.  Skin:    Skin is warm, dry and intact. No rash noted.  No petechiae, purpura, or bullae.  ____________________________________________    LABS (pertinent positives/negatives) (all labs ordered are listed, but only abnormal results are displayed) Labs Reviewed  COMPREHENSIVE METABOLIC PANEL - Abnormal; Notable for the following:       Result Value   Potassium 3.1 (*)    Glucose, Bld 100 (*)    All other components within normal limits  CBC WITH DIFFERENTIAL/PLATELET - Abnormal; Notable for the following:    Neutro Abs 6.6 (*)    All other components within normal limits  ETHANOL  LIPASE, BLOOD   ____________________________________________   EKG    ____________________________________________    RADIOLOGY    ____________________________________________   PROCEDURES Procedures  ____________________________________________   INITIAL IMPRESSION / ASSESSMENT AND PLAN / ED  COURSE  Pertinent labs & imaging results that were available during my care of the patient were reviewed by me and considered in my medical decision making (see chart for details).  Patient presents with severe abdominal pain and dry heaving. No actual vomiting in the emergency department. Tox screen was positive for opiates benzos and THC in the past. This presentation could be related to polysubstance abuse. However, with his recent lab abnormalities we will repeat labs while giving some Haldol and Benadryl for his symptoms.     Clinical Course  Comment By Time  Notified by nurse that the patient was "locking up" after Haldol administration. I reassessed the patient, found that reflexes are symmetric, 2+ bilaterally. Normal tone. He still follows commands and can move all extremities but is just holding his arms in the air. Not consistent with NMS or serotonin syndrome. Sharman Cheek, MD 08/12 1238  Patient reports pain is improved after Toradol. Nausea is also improved but still somewhat persistent. Labs are normal. We'll give IV Reglan and hopefully be able to do a trial of  oral intake.Marland Kitchen Sharman Cheek, MD 08/12 1349  Patient started taking ginger ale for by mouth trial 20 minutes ago. He is now starting to dry heave again. We'll give him Bentyl injection and IV Zofran.  If symptoms are controlled after these medications he may need to be readmitted for intractable symptoms. Sharman Cheek, MD 08/12 1455     ----------------------------------------- 3:14 PM on 04/24/2016 -----------------------------------------  Care the patient is signed out to Dr. Inocencio Homes. If no response to most recent medications, plan to hospitalize again for intractable symptoms.   ____________________________________________   FINAL CLINICAL IMPRESSION(S) / ED DIAGNOSES  Final diagnoses:  Dry heaves  Generalized abdominal pain       Portions of this note were generated with dragon dictation  software. Dictation errors may occur despite best attempts at proofreading.    Sharman Cheek, MD 04/24/16 [phone]

## 2016-04-24 NOTE — Progress Notes (Signed)
Patient discharge summary reviewed with verbal understanding. VSS at this time. Girlfriend present to transport.

## 2016-04-24 NOTE — Progress Notes (Signed)
Patient left room without informing nursing staff. Nursing supervisor Ann notified. Code Walker called. Ann located patient at [employer]. Patient non complaint with diet.

## 2016-04-24 NOTE — Progress Notes (Signed)
Upon arrival to room. Patient OOB. Patient states that he took shower and feels better. Patient resting and talking in bed with family.

## 2016-04-25 LAB — CBC
HEMATOCRIT: 38.6 % — AB (ref 40.0–52.0)
Hemoglobin: 13.7 g/dL (ref 13.0–18.0)
MCH: 33.3 pg (ref 26.0–34.0)
MCHC: 35.6 g/dL (ref 32.0–36.0)
MCV: 93.7 fL (ref 80.0–100.0)
PLATELETS: 199 10*3/uL (ref 150–440)
RBC: 4.12 MIL/uL — AB (ref 4.40–5.90)
RDW: 12.9 % (ref 11.5–14.5)
WBC: 10.4 10*3/uL (ref 3.8–10.6)

## 2016-04-25 LAB — BASIC METABOLIC PANEL
Anion gap: 6 (ref 5–15)
BUN: 8 mg/dL (ref 6–20)
CHLORIDE: 107 mmol/L (ref 101–111)
CO2: 27 mmol/L (ref 22–32)
CREATININE: 0.69 mg/dL (ref 0.61–1.24)
Calcium: 8.1 mg/dL — ABNORMAL LOW (ref 8.9–10.3)
GFR calc non Af Amer: >60 mL/min (ref >60)
Glucose, Bld: 96 mg/dL (ref 65–99)
Potassium: 3.4 mmol/L — ABNORMAL LOW (ref 3.5–5.1)
Sodium: 140 mmol/L (ref 135–145)

## 2016-04-25 NOTE — Progress Notes (Signed)
DISCHARGE INSTRUCTIONS:  Pt given discharge instructions. Pt verbalized understanding.

## 2016-04-25 NOTE — Discharge Summary (Signed)
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 GERD (gastroesophageal reflux disease)     HOSPITAL COURSE:   40 year old male readmitted for intractable nausea and vomiting. 1. Nausea and vomiting: Intractable -tox screen postive for cannabinoids -EGD in 2016 showed gastritis IV famotidine bid---change to po prilosec bid -spoke with GI who had seen pt before d/c y'day. Recommends to get colonsocopy done. CT abdomen negative -pt says he cannot stay one more day or else he will loose his job. He tells me he will get it done for sure as outpt. I mentioned to him we cannot get the procedure done today being Sunday and will have to be done during weekdays. He understands it and still wants to go home and do it as out pt Recommended to be easy with his diet. -will give CLD before d/c  2. GERD: Continue ppi  3. DVT prophylaxis: Lovenox  4. Advised to abstain from drinking ETOH and refrain from smoking  spoke with his girlfriend and voiced understanding  D/c home CONSULTS OBTAINED:  Treatment Team:  Lacey JensenSteven Solik, MD  DRUG ALLERGIES:  No Known Allergies  DISCHARGE MEDICATIONS:   Current Discharge Medication List    CONTINUE these medications which have NOT CHANGED   Details  omeprazole (PRILOSEC) 20 MG capsule Take 1 capsule (20 mg total) by mouth 2 (two) times daily before a meal. Qty: 60 capsule, Refills: 1    ondansetron (ZOFRAN ODT) 4 MG disintegrating tablet Take 1 tablet  (4 mg total) by mouth every 8 (eight) hours as needed for nausea or vomiting. Qty: 20 tablet, Refills: 0    sucralfate (CARAFATE) 1 G tablet Take 1 tablet (1 g total) by mouth 4 (four) times daily. Qty: 120 tablet, Refills: 1        If you experience worsening of your admission symptoms, develop shortness of breath, life threatening emergency, suicidal or homicidal thoughts you must seek medical attention immediately by calling 911 or calling your MD immediately  if symptoms less severe.  You Must read complete instructions/literature along with all the possible adverse reactions/side effects for all the Medicines you take and that have been prescribed to you. Take any new Medicines after you have completely understood and accept all the possible adverse reactions/side effects.   Please note  You were cared for by a hospitalist during your hospital stay. If you have any questions about your discharge medications or the care you received while you were in the hospital after you are discharged, you can call the unit and asked to speak with the hospitalist on call if the hospitalist that took care of you is not available. Once you are discharged, your primary care physician will handle any further medical issues. Please note that NO REFILLS for any discharge medications will be authorized once you are discharged, as it is imperative that you return to your primary care physician (or establish a relationship with a primary care physician if you do not have one) for your aftercare needs so that they  can reassess your need for medications and monitor your lab values. Today   SUBJECTIVE   No more vomiting. Feeling hungry  VITAL SIGNS:  Blood pressure 127/78, pulse 61, temperature 98.2 F (36.8 C), temperature source Oral, resp. rate 18, height 6\' 1"  (1.854 m), weight 77.7 kg (171 lb 4.8 oz), SpO2 100 %.  I/O:   Intake/Output Summary (Last 24 hours) at 04/25/16 1032 Last data filed at 04/25/16  0345  Gross per 24 hour  Intake           1267.5 ml  Output              650 ml  Net            617.5 ml    PHYSICAL EXAMINATION:  GENERAL:  40 y.o.-year-old patient lying in the bed with no acute distress.  EYES: Pupils equal, round, reactive to light and accommodation. No scleral icterus. Extraocular muscles intact.  HEENT: Head atraumatic, normocephalic. Oropharynx and nasopharynx clear.  NECK:  Supple, no jugular venous distention. No thyroid enlargement, no tenderness.  LUNGS: Normal breath sounds bilaterally, no wheezing, rales,rhonchi or crepitation. No use of accessory muscles of respiration.  CARDIOVASCULAR: S1, S2 normal. No murmurs, rubs, or gallops.  ABDOMEN: Soft, non-tender, non-distended. Bowel sounds present. No organomegaly or mass.  EXTREMITIES: No pedal edema, cyanosis, or clubbing.  NEUROLOGIC: Cranial nerves II through XII are intact. Muscle strength 5/5 in all extremities. Sensation intact. Gait not checked.  PSYCHIATRIC: The patient is alert and oriented x 3.  SKIN: No obvious rash, lesion, or ulcer.   DATA REVIEW:   CBC   Recent Labs Lab 04/25/16 0402  WBC 10.4  HGB 13.7  HCT 38.6*  PLT 199    Chemistries   Recent Labs Lab 04/24/16 1200 04/25/16 0402  NA 139 140  K 3.1* 3.4*  CL 104 107  CO2 26 27  GLUCOSE 100* 96  BUN 10 8  CREATININE 0.84 0.69  CALCIUM 9.1 8.1*  AST 30  --   ALT 26  --   ALKPHOS 68  --   BILITOT 0.8  --     Microbiology Results   Recent Results (from the past 240 hour(s))  Urine culture     Status: None   Collection Time: 04/23/16 12:06 AM  Result Value Ref Range Status   Specimen Description URINE, RANDOM  Final   Special Requests NONE  Final   Culture NO GROWTH Performed at [facility]   Final   Report Status 04/24/2016 FINAL  Final  Blood Culture (routine x 2)     Status: None (Preliminary result)   Collection Time: 04/23/16  1:34 AM  Result Value Ref Range Status   Specimen Description BLOOD  LEFT ASSIST CONTROL  Final   Special Requests BOTTLES DRAWN AEROBIC AND ANAEROBIC 8CC  Final   Culture NO GROWTH 1 DAY  Final   Report Status PENDING  Incomplete  Blood Culture (routine x 2)     Status: None (Preliminary result)   Collection Time: 04/23/16  1:34 AM  Result Value Ref Range Status   Specimen Description BLOOD RIGHT ASSIST CONTROL  Final   Special Requests   Final    BOTTLES DRAWN AEROBIC AND ANAEROBIC 7CC AERO 8CC ANA   Culture NO GROWTH 1 DAY  Final   Report Status PENDING  Incomplete    RADIOLOGY:  No results found.   Management plans discussed with the patient, family and they are in agreement.  CODE STATUS:     Code Status Orders        Start     Ordered   04/24/16 1844  Full code  Continuous     04/24/16 1843    Code Status History    Date Active Date Inactive Code Status Order ID Comments User Context   04/23/2016  2:04 AM 04/24/2016 11:50 AM Full Code [id number]  Arnaldo Natal, MD ED   02/28/2015 12:56 PM 03/02/2015  2:07 PM Full Code [id number]  Alford Highland, MD ED      TOTAL TIME TAKING CARE OF THIS PATIENT: 40 minutes.    Rylyn Ranganathan M.D on 04/25/2016 at 10:32 AM  Between 7am to 6pm - Pager - [phone] After 6pm go to [email or web address] - password [email or web address] [facility]  [facility] [place] [facility]  Office  [phone]  CC: Primary care physician; No PCP Per Patient

## 2016-04-28 LAB — CULTURE, BLOOD (ROUTINE X 2)
CULTURE: NO GROWTH
Culture: NO GROWTH

## 2016-08-09 ENCOUNTER — Emergency Department
Admission: EM | Admit: 2016-08-09 | Discharge: 2016-08-09 | Disposition: A | Discharge status: Home / Self Care | Payer: Self-pay | Attending: Emergency Medicine | Admitting: Emergency Medicine

## 2016-08-09 DIAGNOSIS — Y929 Unspecified place or not applicable: Secondary | ICD-10-CM | POA: Insufficient documentation

## 2016-08-09 DIAGNOSIS — S00211A Abrasion of right eyelid and periocular area, initial encounter: Secondary | ICD-10-CM | POA: Insufficient documentation

## 2016-08-09 DIAGNOSIS — F17210 Nicotine dependence, cigarettes, uncomplicated: Secondary | ICD-10-CM | POA: Insufficient documentation

## 2016-08-09 DIAGNOSIS — Y939 Activity, unspecified: Secondary | ICD-10-CM | POA: Insufficient documentation

## 2016-08-09 DIAGNOSIS — Y999 Unspecified external cause status: Secondary | ICD-10-CM | POA: Insufficient documentation

## 2016-08-09 DIAGNOSIS — X58XXXA Exposure to other specified factors, initial encounter: Secondary | ICD-10-CM | POA: Insufficient documentation

## 2016-08-09 MED ORDER — KETOROLAC TROMETHAMINE 0.5 % OP SOLN: 1.0000 [drp] | Freq: Four times a day (QID) | OPHTHALMIC | 0 refills | Status: AC

## 2016-08-09 MED ORDER — FLUORESCEIN SODIUM 1 MG OP STRP
1.0000 | ORAL_STRIP | Freq: Once | OPHTHALMIC | Status: DC
  Filled 2016-08-09: qty 1

## 2016-08-09 MED ORDER — TETRACAINE HCL 0.5 % OP SOLN
2.0000 [drp] | Freq: Once | OPHTHALMIC | Status: DC
  Filled 2016-08-09: qty 2
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
## 2016-08-11 ENCOUNTER — Emergency Department
Admission: EM | Admit: 2016-08-11 | Discharge: 2016-08-12 | Disposition: A | Discharge status: Home / Self Care | Payer: Self-pay | Attending: Emergency Medicine | Admitting: Emergency Medicine

## 2016-08-11 ENCOUNTER — Emergency Department: Payer: Self-pay

## 2016-08-11 DIAGNOSIS — Z79899 Other long term (current) drug therapy: Secondary | ICD-10-CM | POA: Insufficient documentation

## 2016-08-11 DIAGNOSIS — R1084 Generalized abdominal pain: Secondary | ICD-10-CM

## 2016-08-11 DIAGNOSIS — K529 Noninfective gastroenteritis and colitis, unspecified: Secondary | ICD-10-CM | POA: Insufficient documentation

## 2016-08-11 DIAGNOSIS — F17210 Nicotine dependence, cigarettes, uncomplicated: Secondary | ICD-10-CM | POA: Insufficient documentation

## 2016-08-11 LAB — CBC WITH DIFFERENTIAL/PLATELET
BASOS ABS: 0.1 10*3/uL (ref 0–0.1)
Basophils Relative: 0 %
EOS PCT: 0 %
Eosinophils Absolute: 0 10*3/uL (ref 0–0.7)
HCT: 48.5 % (ref 40.0–52.0)
Hemoglobin: 16.4 g/dL (ref 13.0–18.0)
LYMPHS PCT: 13 %
Lymphs Abs: 2.5 10*3/uL (ref 1.0–3.6)
MCH: 32.4 pg (ref 26.0–34.0)
MCHC: 33.7 g/dL (ref 32.0–36.0)
MCV: 96 fL (ref 80.0–100.0)
MONO ABS: 0.9 10*3/uL (ref 0.2–1.0)
Monocytes Relative: 5 %
Neutro Abs: 15 10*3/uL — ABNORMAL HIGH (ref 1.4–6.5)
Neutrophils Relative %: 82 %
PLATELETS: 294 10*3/uL (ref 150–440)
RBC: 5.06 MIL/uL (ref 4.40–5.90)
RDW: 13.2 % (ref 11.5–14.5)
WBC: 18.5 10*3/uL — ABNORMAL HIGH (ref 3.8–10.6)

## 2016-08-11 LAB — COMPREHENSIVE METABOLIC PANEL
ALK PHOS: 77 U/L (ref 38–126)
ALT: 45 U/L (ref 17–63)
ANION GAP: 15 (ref 5–15)
AST: 40 U/L (ref 15–41)
Albumin: 5.3 g/dL — ABNORMAL HIGH (ref 3.5–5.0)
BILIRUBIN TOTAL: 0.8 mg/dL (ref 0.3–1.2)
BUN: 14 mg/dL (ref 6–20)
CALCIUM: 10.2 mg/dL (ref 8.9–10.3)
CO2: 24 mmol/L (ref 22–32)
Chloride: 102 mmol/L (ref 101–111)
Creatinine, Ser: 0.97 mg/dL (ref 0.61–1.24)
Glucose, Bld: 159 mg/dL — ABNORMAL HIGH (ref 65–99)
POTASSIUM: 4 mmol/L (ref 3.5–5.1)
Sodium: 141 mmol/L (ref 135–145)
TOTAL PROTEIN: 8.5 g/dL — AB (ref 6.5–8.1)

## 2016-08-11 LAB — ACETAMINOPHEN LEVEL: Acetaminophen (Tylenol), Serum: <10 ug/mL — ABNORMAL LOW (ref 10–30)

## 2016-08-11 LAB — ETHANOL

## 2016-08-11 LAB — LIPASE, BLOOD: LIPASE: 20 U/L (ref 11–51)

## 2016-08-11 MED ORDER — ONDANSETRON HCL 4 MG/2ML IJ SOLN
INTRAMUSCULAR | Status: AC
  Administered 2016-08-11: 4 mg via INTRAVENOUS
  Filled 2016-08-11: qty 2

## 2016-08-11 MED ORDER — IOPAMIDOL (ISOVUE-300) INJECTION 61%
100.0000 mL | Freq: Once | INTRAVENOUS | Status: AC | PRN
  Administered 2016-08-11: 100 mL via INTRAVENOUS

## 2016-08-11 MED ORDER — METRONIDAZOLE 500 MG PO TABS
500.0000 mg | ORAL_TABLET | Freq: Once | ORAL | Status: AC
  Administered 2016-08-11: 500 mg via ORAL
  Filled 2016-08-11: qty 1

## 2016-08-11 MED ORDER — DIPHENHYDRAMINE HCL 25 MG PO CAPS: 50.0000 mg | ORAL_CAPSULE | Freq: Four times a day (QID) | ORAL | 0 refills | Status: DC | PRN

## 2016-08-11 MED ORDER — SODIUM CHLORIDE 0.9 % IV BOLUS (SEPSIS)
1000.0000 mL | Freq: Once | INTRAVENOUS | Status: AC
  Administered 2016-08-11: 1000 mL via INTRAVENOUS

## 2016-08-11 MED ORDER — CIPROFLOXACIN HCL 500 MG PO TABS
500.0000 mg | ORAL_TABLET | Freq: Once | ORAL | Status: AC
  Administered 2016-08-11: 500 mg via ORAL
  Filled 2016-08-11: qty 1

## 2016-08-11 MED ORDER — HALOPERIDOL LACTATE 5 MG/ML IJ SOLN
5.0000 mg | Freq: Once | INTRAMUSCULAR | Status: AC
  Administered 2016-08-11: 5 mg via INTRAVENOUS

## 2016-08-11 MED ORDER — MORPHINE SULFATE (PF) 4 MG/ML IV SOLN
4.0000 mg | Freq: Once | INTRAVENOUS | Status: AC
  Administered 2016-08-11: 4 mg via INTRAVENOUS

## 2016-08-11 MED ORDER — CIPROFLOXACIN HCL 500 MG PO TABS: 500.0000 mg | ORAL_TABLET | Freq: Two times a day (BID) | ORAL | 0 refills | Status: DC

## 2016-08-11 MED ORDER — METRONIDAZOLE 500 MG PO TABS: 500.0000 mg | ORAL_TABLET | Freq: Three times a day (TID) | ORAL | 0 refills | Status: DC

## 2016-08-11 MED ORDER — ONDANSETRON HCL 4 MG/2ML IJ SOLN
4.0000 mg | Freq: Once | INTRAMUSCULAR | Status: AC
  Administered 2016-08-11: 4 mg via INTRAVENOUS

## 2016-08-11 MED ORDER — HALOPERIDOL LACTATE 5 MG/ML IJ SOLN
5.0000 mg | Freq: Once | INTRAMUSCULAR | Status: DC
  Filled 2016-08-11: qty 1

## 2016-08-11 MED ORDER — METOCLOPRAMIDE HCL 10 MG PO TABS: 10.0000 mg | ORAL_TABLET | Freq: Four times a day (QID) | ORAL | 0 refills | Status: DC | PRN

## 2016-08-11 MED ORDER — MORPHINE SULFATE (PF) 4 MG/ML IV SOLN
INTRAVENOUS | Status: AC
  Administered 2016-08-11: 4 mg via INTRAVENOUS
  Filled 2016-08-11: qty 1

## 2016-08-11 NOTE — ED Notes (Signed)
Please call Patient's girlfriend for discharge information or updated medical information.  Blake BorsVictoria Kerr can be reached at [phone] or Chapman MossSister, Carey Donald at [phone].

## 2016-08-11 NOTE — ED Triage Notes (Signed)
Pt presents to ED with c/o sharp mid abd pain; onset around 1700. Vomiting frequently since onset of pain. Moaning loudly and very restless during trigage. Diaphoretic. Pt seen in this ED for the same in May and was dx with acid reflux.

## 2016-08-11 NOTE — ED Provider Notes (Signed)
[facility] Emergency Department Provider Note  ____________________________________________  Time seen: Approximately 11:11 PM  I have reviewed the triage vital signs and the nursing notes.   HISTORY  Chief Complaint Abdominal Pain and Emesis    HPI Blake Kerr is a 40 y.o. male who complains of severe generalized abdominal pain since 5 PM today. Sharp, nonradiating. Associated with vomiting. No diarrhea. No fever or chills. Reports he's had about 4 episodes of this in the past. [facility] and had a upper endoscopy or capsule study, which was nondiagnostic except for finding of gastritis. He was posterior back for colonoscopy but has not yet due to financial constraints, all according to the patient.. Denies alcohol or marijuana or other drug use.     Past Medical History:  Diagnosis Date  . GERD (gastroesophageal reflux disease)      Patient Active Problem List   Diagnosis Date Noted  . Intractable nausea and vomiting 04/24/2016  . Sepsis (HCC) 04/23/2016  . Nausea vomiting and diarrhea 02/28/2015     Past Surgical History:  Procedure Laterality Date  . ESOPHAGOGASTRODUODENOSCOPY Bilateral 03/02/2015   Procedure: ESOPHAGOGASTRODUODENOSCOPY (EGD);  Surgeon: Wallace CullensPaul Y Oh, MD;  Location[facility]COPY;  Service: Gastroenterology;  Laterality: Bilateral;     Prior to Admission medications   Medication Sig Start Date End Date Taking? Authorizing Provider  ketorolac (ACULAR) 0.5 % ophthalmic solution Place 1 drop into the right eye 4 (four) times daily. 08/09/16   Delorise RoyalsJonathan D Cuthriell, PA-C  omeprazole (PRILOSEC) 20 MG capsule Take 1 capsule (20 mg total) by mouth 2 (two) times daily before a meal. 04/24/16   Enedina FinnerSona Patel, MD  ondansetron (ZOFRAN ODT) 4 MG disintegrating tablet Take 1 tablet (4 mg total) by mouth every 8 (eight) hours as needed for nausea or vomiting. Patient not taking: Reported on 04/23/2016 04/21/15   Maurilio LovelyNoelle  McLaurin, MD  sucralfate (CARAFATE) 1 G tablet Take 1 tablet (1 g total) by mouth 4 (four) times daily. 04/21/15 04/20/16  Maurilio LovelyNoelle McLaurin, MD     Allergies Patient has no known allergies.   Family History  Problem Relation Age of Onset  . Hypertension Mother   . Hypertension Father   . Hyperlipidemia Father     Social History Social History  Substance Use Topics  . Smoking status: Current Every Day Smoker    Packs/day: 0.50    Types: Cigarettes  . Smokeless tobacco: Never Used  . Alcohol use 8.4 oz/week    14 Cans of beer per week    Review of Systems  Constitutional:   No fever or chills.  ENT:   No sore throat. No rhinorrhea. Cardiovascular:   No chest pain. Respiratory:   No dyspnea or cough. Gastrointestinal:   Positive abdominal pain as above and vomiting..  Genitourinary:   Negative for dysuria or difficulty urinating. Musculoskeletal:   Negative for focal pain or swelling Neurological:   Negative for headaches 10-point ROS otherwise negative.  ____________________________________________   PHYSICAL EXAM:  VITAL SIGNS: ED Triage Vitals  Enc Vitals Group     BP 08/11/16 2014 (!) 152/92     Pulse Rate 08/11/16 2014 80     Resp 08/11/16 2014 (!) 26     Temp 08/11/16 2014 98 F (36.7 C)     Temp Source 08/11/16 2014 Oral     SpO2 08/11/16 2014 100 %     Weight 08/11/16 2015 180 lb (81.6 kg)     Height 08/11/16  2015 6\' 1"  (1.854 m)     Head Circumference --      Peak Flow --      Pain Score --      Pain Loc --      Pain Edu? --      Excl. in GC? --     Vital signs reviewed, nursing assessments reviewed.   Constitutional:   Alert and oriented. Very uncomfortable but not in distress.. Eyes:   No scleral icterus. No conjunctival pallor. PERRL. EOMI.  No nystagmus. ENT   Head:   Normocephalic and atraumatic.   Nose:   No congestion/rhinnorhea. No septal hematoma   Mouth/Throat:   MMM, no pharyngeal erythema. No peritonsillar mass.     Neck:   No stridor. No SubQ emphysema. No meningismus. Hematological/Lymphatic/Immunilogical:   No cervical lymphadenopathy. Cardiovascular:   RRR. Symmetric bilateral radial and DP pulses.  No murmurs.  Respiratory:   Normal respiratory effort without tachypnea nor retractions. Breath sounds are clear and equal bilaterally. No wheezes/rales/rhonchi. Gastrointestinal:   Soft with generalized tenderness. Non distended. There is no CVA tenderness.  No rebound, rigidity, or guarding. Genitourinary:   deferred Musculoskeletal:   Nontender with normal range of motion in all extremities. No joint effusions.  No lower extremity tenderness.  No edema. Neurologic:   Normal speech and language.  CN 2-10 normal. Motor grossly intact. No gross focal neurologic deficits are appreciated.  Skin:    Skin is warm, dry and intact. No rash noted.  No petechiae, purpura, or bullae.  ____________________________________________    LABS (pertinent positives/negatives) (all labs ordered are listed, but only abnormal results are displayed) Labs Reviewed  COMPREHENSIVE METABOLIC PANEL - Abnormal; Notable for the following:       Result Value   Glucose, Bld 159 (*)    Total Protein 8.5 (*)    Albumin 5.3 (*)    All other components within normal limits  CBC WITH DIFFERENTIAL/PLATELET - Abnormal; Notable for the following:    WBC 18.5 (*)    Neutro Abs 15.0 (*)    All other components within normal limits  ACETAMINOPHEN LEVEL - Abnormal; Notable for the following:    Acetaminophen (Tylenol), Serum <10 (*)    All other components within normal limits  ETHANOL  LIPASE, BLOOD  URINALYSIS COMPLETEWITH MICROSCOPIC ([facility] ONLY)  URINE DRUG SCREEN, QUALITATIVE ([facility] ONLY)   ____________________________________________   EKG    ____________________________________________    RADIOLOGY  CT abdomen and pelvis reveals mild sigmoid colitis, otherwise  unremarkable  ____________________________________________   PROCEDURES Procedures  ____________________________________________   INITIAL IMPRESSION / ASSESSMENT AND PLAN / ED COURSE  Pertinent labs & imaging results that were available during my care of the patient were reviewed by me and considered in my medical decision making (see chart for details).  Patient arrives to the ED complaining of severe generalized abdominal pain. Examination is difficult and limited due to the severity of his pain. Labs and CT scan obtained, shows leukocytosis of 18,000. CT shows sigmoid inflammation. With his history of multiple previous bouts, raises concern of inflammatory bowel disease. Strongly encouraged the patient to follow up with [facility] gastroenterology as he had in the past. For now given the patient a course of Cipro and Flagyl, antiemetics.  On repeat assessment at 11:10 PM, abdomen is soft and nontender. Patient feeling much better. Vital signs remained stable.Considering the patient's symptoms, medical history, and physical examination today, I have low suspicion for cholecystitis or biliary pathology, pancreatitis, perforation or  bowel obstruction, hernia, intra-abdominal abscess, AAA or dissection, volvulus or intussusception, mesenteric ischemia, or appendicitis.     Clinical Course    ____________________________________________   FINAL CLINICAL IMPRESSION(S) / ED DIAGNOSES  Final diagnoses:  Generalized abdominal pain  Colitis       Portions of this note were generated with dragon dictation software. Dictation errors may occur despite best attempts at proofreading.    Sharman CheekPhillip Sherley Mckenney, MD 08/11/16 [phone]

## 2016-08-12 NOTE — ED Notes (Signed)
Pt reports still feeling nauseated after zofran. MD notified.

## 2016-08-12 NOTE — ED Notes (Signed)
Pt reports feeling nauseated, however disconnected himself from all of his monitoring equipment so that he could get up to drink all of the gingerale in a cup that was out of his reach. Pt has had no vomiting since doing so. Vital signs all WNL. MD notified.

## 2016-08-12 NOTE — ED Notes (Addendum)
Discharge instructions and prescriptions reviewed with patient. Patient verbalized understanding. Pt reports "I still don't feel really great and I don't feel 100%" This RN explained that he wouldn't feel back to his normal until his colitis cleared up and that that wasn't something that we could do in one night in the ER and stressed the importance of [facility]. Pt verbalized understanding. Patient taken to lobby via wheelchair to wait for ride.

## 2016-08-13 ENCOUNTER — Encounter (HOSPITAL_COMMUNITY): Payer: Self-pay | Admitting: Emergency Medicine

## 2016-08-13 ENCOUNTER — Emergency Department (HOSPITAL_COMMUNITY)
Admission: EM | Admit: 2016-08-13 | Discharge: 2016-08-13 | Disposition: A | Discharge status: Home / Self Care | Payer: Self-pay | Attending: Emergency Medicine | Admitting: Emergency Medicine

## 2016-08-13 DIAGNOSIS — R1084 Generalized abdominal pain: Secondary | ICD-10-CM | POA: Insufficient documentation

## 2016-08-13 DIAGNOSIS — F17210 Nicotine dependence, cigarettes, uncomplicated: Secondary | ICD-10-CM | POA: Insufficient documentation

## 2016-08-13 LAB — COMPREHENSIVE METABOLIC PANEL
ALK PHOS: 84 U/L (ref 38–126)
ALT: 43 U/L (ref 17–63)
AST: 39 U/L (ref 15–41)
Albumin: 5.9 g/dL — ABNORMAL HIGH (ref 3.5–5.0)
Anion gap: 15 (ref 5–15)
BILIRUBIN TOTAL: 1 mg/dL (ref 0.3–1.2)
BUN: 25 mg/dL — AB (ref 6–20)
CALCIUM: 10.1 mg/dL (ref 8.9–10.3)
CHLORIDE: 96 mmol/L — AB (ref 101–111)
CO2: 28 mmol/L (ref 22–32)
CREATININE: 1.16 mg/dL (ref 0.61–1.24)
Glucose, Bld: 174 mg/dL — ABNORMAL HIGH (ref 65–99)
Potassium: 3.5 mmol/L (ref 3.5–5.1)
Sodium: 139 mmol/L (ref 135–145)
TOTAL PROTEIN: 9.7 g/dL — AB (ref 6.5–8.1)

## 2016-08-13 LAB — CBC
HCT: 49.7 % (ref 39.0–52.0)
Hemoglobin: 17.5 g/dL — ABNORMAL HIGH (ref 13.0–17.0)
MCH: 32.9 pg (ref 26.0–34.0)
MCHC: 35.2 g/dL (ref 30.0–36.0)
MCV: 93.4 fL (ref 78.0–100.0)
PLATELETS: 222 10*3/uL (ref 150–400)
RBC: 5.32 MIL/uL (ref 4.22–5.81)
RDW: 12.6 % (ref 11.5–15.5)
WBC: 13.1 10*3/uL — ABNORMAL HIGH (ref 4.0–10.5)

## 2016-08-13 LAB — URINALYSIS, ROUTINE W REFLEX MICROSCOPIC
GLUCOSE, UA: NEGATIVE mg/dL
HGB URINE DIPSTICK: NEGATIVE
KETONES UR: NEGATIVE mg/dL
Nitrite: NEGATIVE
PROTEIN: 100 mg/dL — AB
Specific Gravity, Urine: 1.041 — ABNORMAL HIGH (ref 1.005–1.030)
pH: 5.5 (ref 5.0–8.0)

## 2016-08-13 LAB — URINE MICROSCOPIC-ADD ON

## 2016-08-13 LAB — I-STAT CG4 LACTIC ACID, ED
LACTIC ACID, VENOUS: 3.73 mmol/L — AB (ref 0.5–1.9)
LACTIC ACID, VENOUS: 2.26 mmol/L — AB (ref 0.5–1.9)

## 2016-08-13 LAB — C-REACTIVE PROTEIN: CRP: <0.8 mg/dL (ref <1.0)

## 2016-08-13 LAB — SEDIMENTATION RATE: Sed Rate: 0 mm/hr (ref 0–16)

## 2016-08-13 LAB — LIPASE, BLOOD: LIPASE: 17 U/L (ref 11–51)

## 2016-08-13 LAB — POC OCCULT BLOOD, ED: Fecal Occult Bld: NEGATIVE

## 2016-08-13 MED ORDER — SODIUM CHLORIDE 0.9 % IV BOLUS (SEPSIS)
1000.0000 mL | Freq: Once | INTRAVENOUS | Status: AC
  Administered 2016-08-13: 1000 mL via INTRAVENOUS

## 2016-08-13 MED ORDER — MORPHINE SULFATE (PF) 4 MG/ML IV SOLN
4.0000 mg | Freq: Once | INTRAVENOUS | Status: AC
  Administered 2016-08-13: 4 mg via INTRAVENOUS
  Filled 2016-08-13: qty 1

## 2016-08-13 MED ORDER — HALOPERIDOL LACTATE 5 MG/ML IJ SOLN
5.0000 mg | Freq: Once | INTRAMUSCULAR | Status: AC
  Administered 2016-08-13: 5 mg via INTRAVENOUS
  Filled 2016-08-13: qty 1

## 2016-08-13 NOTE — ED Provider Notes (Signed)
[place] DEPT Provider Note   CSN: [id number] Arrival date & time: 08/13/16  1229  History   Chief Complaint Chief Complaint  Patient presents with  . Abdominal Pain  . Nausea    HPI Blake Kerr is a 40 y.o. male.  HPI  40 y.o. male with a hx of GERD, presents to the Emergency Department today complaining of generalized abdominal pain x 3-4 days. Notes multiple admission for same. Seen at [facility] on 08-11-16 for same and Dx with Colitis and given ABX (Cipro/Flagyl). CT ABD/Pelvis at that time showed sigmoid inflammation. Pt notes no improvement with medication as he is still throwing up after PO intake. Notes diarrhea intermittently as well. States pain is generalized and a cramping sensation that is constant. Seen by GI at [facility] with endoscopy that was performed recently that was unremarkable. Colonoscopy is pending once insurance is validate with patient. Denies hematochezia. Notes dark stools the past 2-3 days. Denies ETOH and drug abuse. No ther symptoms noted.    Past Medical History:  Diagnosis Date  . GERD (gastroesophageal reflux disease)     Patient Active Problem List   Diagnosis Date Noted  . Intractable nausea and vomiting 04/24/2016  . Sepsis ([place]) 04/23/2016  . Nausea vomiting and diarrhea 02/28/2015    Past Surgical History:  Procedure Laterality Date  . ESOPHAGOGASTRODUODENOSCOPY Bilateral 03/02/2015   Procedure: ESOPHAGOGASTRODUODENOSCOPY (EGD);  Surgeon: Hulen Luster, MD;  Location: [facility] ENDOSCOPY;  Service: Gastroenterology;  Laterality: Bilateral;     Home Medications    Prior to Admission medications   Medication Sig Start Date End Date Taking? Authorizing Provider  ciprofloxacin (CIPRO) 500 MG tablet Take 1 tablet (500 mg total) by mouth 2 (two) times daily. 08/11/16   Carrie Mew, MD  diphenhydrAMINE (BENADRYL) 25 mg capsule Take 2 capsules (50 mg total) by mouth every 6 (six) hours as needed. 08/11/16   Carrie Mew, MD   ketorolac (ACULAR) 0.5 % ophthalmic solution Place 1 drop into the right eye 4 (four) times daily. 08/09/16   Charline Bills Cuthriell, PA-C  metoCLOPramide (REGLAN) 10 MG tablet Take 1 tablet (10 mg total) by mouth every 6 (six) hours as needed. 08/11/16   Carrie Mew, MD  metroNIDAZOLE (FLAGYL) 500 MG tablet Take 1 tablet (500 mg total) by mouth 3 (three) times daily. 08/11/16   Carrie Mew, MD  omeprazole (PRILOSEC) 20 MG capsule Take 1 capsule (20 mg total) by mouth 2 (two) times daily before a meal. 04/24/16   Fritzi Mandes, MD  ondansetron (ZOFRAN ODT) 4 MG disintegrating tablet Take 1 tablet (4 mg total) by mouth every 8 (eight) hours as needed for nausea or vomiting. Patient not taking: Reported on 04/23/2016 04/21/15   Ponciano Ort, MD  sucralfate (CARAFATE) 1 G tablet Take 1 tablet (1 g total) by mouth 4 (four) times daily. 04/21/15 04/20/16  Ponciano Ort, MD    Family History Family History  Problem Relation Age of Onset  . Hypertension Mother   . Hypertension Father   . Hyperlipidemia Father     Social History Social History  Substance Use Topics  . Smoking status: Current Every Day Smoker    Packs/day: 0.50    Types: Cigarettes  . Smokeless tobacco: Never Used  . Alcohol use 8.4 oz/week    14 Cans of beer per week     Allergies   Patient has no known allergies.   Review of Systems Review of Systems ROS reviewed and all are negative for acute  change except as noted in the HPI.  Physical Exam Updated Vital Signs BP 141/98 (BP Location: Left Arm)   Pulse 105   Temp 98.4 F (36.9 C) (Oral)   Resp 20   SpO2 99%   Physical Exam  Constitutional: He is oriented to person, place, and time. Vital signs are normal. He appears well-developed and well-nourished.  Appears uncomfortable. Answers questions appropriately  HENT:  Head: Normocephalic.  Right Ear: Hearing normal.  Left Ear: Hearing normal.  Eyes: Conjunctivae and EOM are normal. Pupils are equal,  round, and reactive to light.  Neck: Normal range of motion. Neck supple.  Cardiovascular: Normal rate, regular rhythm, normal heart sounds and intact distal pulses.   Pulmonary/Chest: Effort normal and breath sounds normal.  Abdominal: Normal appearance and bowel sounds are normal. There is generalized tenderness. There is no rigidity, no rebound, no guarding, no CVA tenderness, no tenderness at McBurney's point and negative Murphy's sign.  Genitourinary:  Genitourinary Comments: Chaperone was present. Patient with NO pain around the rectal area. There are no external fissures noted. No induration of the skin or swelling. No external hemorrhoids seen. Patient able to tolerate examination. I was able to feel the first 2-3cm of the rectum digitally without gross abnormality. There is no gross blood.  NO signs of perirectal abscess.  Musculoskeletal: Normal range of motion.  Neurological: He is alert and oriented to person, place, and time.  Skin: Skin is warm and dry.  Psychiatric: He has a normal mood and affect. His speech is normal and behavior is normal. Thought content normal.  Nursing note and vitals reviewed.  ED Treatments / Results  Labs (all labs ordered are listed, but only abnormal results are displayed) Labs Reviewed  COMPREHENSIVE METABOLIC PANEL - Abnormal; Notable for the following:       Result Value   Chloride 96 (*)    Glucose, Bld 174 (*)    BUN 25 (*)    Total Protein 9.7 (*)    Albumin 5.9 (*)    All other components within normal limits  CBC - Abnormal; Notable for the following:    WBC 13.1 (*)    Hemoglobin 17.5 (*)    All other components within normal limits  URINALYSIS, ROUTINE W REFLEX MICROSCOPIC (NOT AT [facility]) - Abnormal; Notable for the following:    Color, Urine ORANGE (*)    Specific Gravity, Urine 1.041 (*)    Bilirubin Urine SMALL (*)    Protein, ur 100 (*)    Leukocytes, UA TRACE (*)    All other components within normal limits  URINE  MICROSCOPIC-ADD ON - Abnormal; Notable for the following:    Squamous Epithelial / LPF 0-5 (*)    Bacteria, UA MANY (*)    All other components within normal limits  I-STAT CG4 LACTIC ACID, ED - Abnormal; Notable for the following:    Lactic Acid, Venous 3.73 (*)    All other components within normal limits  CULTURE, BLOOD (ROUTINE X 2)  CULTURE, BLOOD (ROUTINE X 2)  LIPASE, BLOOD  SEDIMENTATION RATE  C-REACTIVE PROTEIN  POC OCCULT BLOOD, ED    EKG  EKG Interpretation None       Radiology Ct Abdomen Pelvis W Contrast  Result Date: 08/11/2016 CLINICAL DATA:  Sharp mid abdominal pain with vomiting EXAM: CT ABDOMEN AND PELVIS WITH CONTRAST TECHNIQUE: Multidetector CT imaging of the abdomen and pelvis was performed using the standard protocol following bolus administration of intravenous contrast. CONTRAST:  ISOVUE-300  IOPAMIDOL (ISOVUE-300) INJECTION 61% COMPARISON:  04/22/2016 FINDINGS: Lower chest: Lung bases demonstrate no acute consolidation or pleural effusion. Normal heart size. Distal esophagus is within normal limits. Hepatobiliary: No focal liver abnormality is seen. No gallstones, gallbladder wall thickening, or biliary dilatation. Pancreas: Unremarkable. No pancreatic ductal dilatation or surrounding inflammatory changes. Spleen: Normal in size without focal abnormality. Adrenals/Urinary Tract: Adrenal glands are unremarkable. Kidneys are normal, without renal calculi, focal lesion, or hydronephrosis. Bladder is unremarkable. Stomach/Bowel: There is granulated appearance of the proximal stomach, stable compared with 04/22/2016, different compared with 02/28/2016, possibly related to under distension and lack of oral contrast. No gross wall thickening is seen. No evidence for a bowel obstruction. The colon is collapsed. Mild wall thickening of the sigmoid colon. No definite surrounding edema. Appendix is visualized and is normal. Vascular/Lymphatic: Aortic atherosclerosis. No  enlarged abdominal or pelvic lymph nodes. Reproductive: Prostate is unremarkable. Other: No abdominal wall hernia or abnormality. No abdominopelvic ascites. Musculoskeletal: No acute or significant osseous findings. IMPRESSION: 1. Mild focal wall thickening of the sigmoid colon could relate to a mild focal area of colitis, no significant surrounding edema. Prominent fat deposition within the colon wall could relate to chronic inflammation (? any history of inflammatory bowel disease) 2. Granulated appearance of the stomach but without wall thickening, this is suspected to be due to underdistention. Electronically Signed   By: Donavan Foil M.D.   On: 08/11/2016 21:49    Procedures Procedures (including critical care time)  Medications Ordered in ED Medications  morphine 4 MG/ML injection 4 mg (not administered)  haloperidol lactate (HALDOL) injection 5 mg (not administered)  sodium chloride 0.9 % bolus 1,000 mL (1,000 mLs Intravenous New Bag/Given 08/13/16 1247)     Initial Impression / Assessment and Plan / ED Course  I have reviewed the triage vital signs and the nursing notes.  Pertinent labs & imaging results that were available during my care of the patient were reviewed by me and considered in my medical decision making (see chart for details).  Clinical Course    Final Clinical Impressions(s) / ED Diagnoses  {I have reviewed and evaluated the relevant laboratory values. {I have reviewed and evaluated the relevant imaging studies.  {I have reviewed the relevant previous healthcare records.  {I obtained HPI from historian. {Patient discussed with supervising physician.  ED Course:  Assessment: Patient is a 40yM presents with abdominal pain x 3-4 days. Seen in past for same. Admitted for same. Seen by GI at [facility]. Endoscopy unremarkable except for gastritis. Colonoscopy pending once insurance kicks in for patient. Notes N/V/D and decrease PO intake. Currently on Cipro/Flagyl for suspect  Colitis. CT scan at [facility] on 08-11-16 unremarkable other than sigmoid inflammation. On exam, nontoxic, nonseptic appearing, appears uncomfortabel. Patient's pain and other symptoms adequately managed in emergency department.  Fluid bolus given.  Labs, imaging and vitals reviewed.  CBC 13.1. CMP with Creatinine WNL. Lipase negative. UA unremarkable. iStat Lactic 3.73. Suspect likely related to dehydration from emesis. Given possibility of IBD, ordered CRP, which is a send out as well as an ESR, which was 0. Pt also noted dark stools the past 2-3 days. No hematochezia. Guaiac Stool negative. Discussed with attending physician. Patient does not meet the SIRS or Sepsis criteria.  On repeat exam patient does not have a surgical abdomen and there are no peritoneal signs.  No indication of appendicitis, bowel obstruction, bowel perforation, cholecystitis, diverticulitis. Pt symptoms resolved in ED with Haldol. Repeat iStat Lactic Acid 2.26 after  2L NS Bolus. Will have pt finish 1L NS more and DC home. Counseled patient to have close follow up with [facility]. Pt with Rx Reglan already, pt has not filled Reglan Rx yet and will do so when going home. Continue regiment. Counseled on Diet. Continue ABX from before. Patient discharged home with symptomatic treatment and given strict instructions for follow-up with their GI doctor for a colonoscopy.  I have also discussed reasons to return immediately to the ER.  Patient expresses understanding and agrees with plan.  Disposition/Plan:  DC Home Additional Verbal discharge instructions given and discussed with patient.  Pt Instructed to f/u with Gi at [facility] in the next week for evaluation and treatment of symptoms. Return precautions given Pt acknowledges and agrees with plan  Supervising Physician Sharlett Iles, MD  Final diagnoses:  Generalized abdominal pain    New Prescriptions New Prescriptions   No medications on file     Shary Decamp, PA-C 08/13/16  1455    Shary Decamp, PA-C 08/13/16 [place], MD 08/15/16 [phone]

## 2016-08-13 NOTE — ED Triage Notes (Signed)
Patient is complaining of abdominal pain all over.  He has increased N/V.  Denies drinking any alcohol.  He states lying still is worse.

## 2016-08-13 NOTE — Discharge Instructions (Signed)
Please read and follow all provided instructions.  Your diagnoses today include:  1. Generalized abdominal pain    Tests performed today include: Blood counts and electrolytes Blood tests to check liver and kidney function Blood tests to check pancreas function Urine test to look for infection and pregnancy (in women) Vital signs. See below for your results today.   Medications prescribed:   Take any prescribed medications only as directed.  Home care instructions:  Follow any educational materials contained in this packet.  Follow-up instructions: Please follow-up with your Gastroenterologist for further evaluation of your symptoms. You'll need a Colonoscopy for further evaluation of your symptoms.     Return instructions:  SEEK IMMEDIATE MEDICAL ATTENTION IF: The pain does not go away or becomes severe  A temperature above 101F develops  Repeated vomiting occurs (multiple episodes)  The pain becomes localized to portions of the abdomen. The right side could possibly be appendicitis. In an adult, the left lower portion of the abdomen could be colitis or diverticulitis.  Blood is being passed in stools or vomit (bright red or black tarry stools)  You develop chest pain, difficulty breathing, dizziness or fainting, or become confused, poorly responsive, or inconsolable (young children) If you have any other emergent concerns regarding your health  Additional Information: Abdominal (belly) pain can be caused by many things. Your caregiver performed an examination and possibly ordered blood/urine tests and imaging (CT scan, x-rays, ultrasound). Many cases can be observed and treated at home after initial evaluation in the emergency department. Even though you are being discharged home, abdominal pain can be unpredictable. Therefore, you need a repeated exam if your pain does not resolve, returns, or worsens. Most patients with abdominal pain don't have to be admitted to the hospital or  have surgery, but serious problems like appendicitis and gallbladder attacks can start out as nonspecific pain. Many abdominal conditions cannot be diagnosed in one visit, so follow-up evaluations are very important.  Your vital signs today were: BP 123/81    Pulse 72    Temp 98.4 F (36.9 C) (Oral)    Resp 18    SpO2 98%  If your blood pressure (bp) was elevated above 135/85 this visit, please have this repeated by your doctor within one month. --------------

## 2016-08-19 LAB — CULTURE, BLOOD (ROUTINE X 2)
CULTURE: NO GROWTH
CULTURE: NO GROWTH

## 2016-10-06 ENCOUNTER — Emergency Department (HOSPITAL_COMMUNITY)
Admission: EM | Admit: 2016-10-06 | Discharge: 2016-10-06 | Disposition: A | Discharge status: Home / Self Care | Payer: Self-pay | Attending: Emergency Medicine | Admitting: Emergency Medicine

## 2016-10-06 ENCOUNTER — Encounter (HOSPITAL_COMMUNITY): Payer: Self-pay | Admitting: Emergency Medicine

## 2016-10-06 DIAGNOSIS — F17210 Nicotine dependence, cigarettes, uncomplicated: Secondary | ICD-10-CM | POA: Insufficient documentation

## 2016-10-06 DIAGNOSIS — R1115 Cyclical vomiting syndrome unrelated to migraine: Secondary | ICD-10-CM

## 2016-10-06 DIAGNOSIS — G43A0 Cyclical vomiting, not intractable: Secondary | ICD-10-CM | POA: Insufficient documentation

## 2016-10-06 LAB — CBC WITH DIFFERENTIAL/PLATELET
BASOS PCT: 0 %
Basophils Absolute: 0 10*3/uL (ref 0.0–0.1)
EOS ABS: 0 10*3/uL (ref 0.0–0.7)
Eosinophils Relative: 0 %
HCT: 43.7 % (ref 39.0–52.0)
Hemoglobin: 15.6 g/dL (ref 13.0–17.0)
Lymphocytes Relative: 11 %
Lymphs Abs: 1.5 10*3/uL (ref 0.7–4.0)
MCH: 32.7 pg (ref 26.0–34.0)
MCHC: 35.7 g/dL (ref 30.0–36.0)
MCV: 91.6 fL (ref 78.0–100.0)
MONOS PCT: 3 %
Monocytes Absolute: 0.4 10*3/uL (ref 0.1–1.0)
Neutro Abs: 11.2 10*3/uL — ABNORMAL HIGH (ref 1.7–7.7)
Neutrophils Relative %: 86 %
Platelets: 289 10*3/uL (ref 150–400)
RBC: 4.77 MIL/uL (ref 4.22–5.81)
RDW: 12.4 % (ref 11.5–15.5)
WBC: 13.1 10*3/uL — ABNORMAL HIGH (ref 4.0–10.5)

## 2016-10-06 LAB — URINALYSIS, ROUTINE W REFLEX MICROSCOPIC
BILIRUBIN URINE: NEGATIVE
Glucose, UA: NEGATIVE mg/dL
HGB URINE DIPSTICK: NEGATIVE
Ketones, ur: 5 mg/dL — AB
Leukocytes, UA: NEGATIVE
Nitrite: NEGATIVE
PROTEIN: NEGATIVE mg/dL
SPECIFIC GRAVITY, URINE: 1.023 (ref 1.005–1.030)
pH: 7 (ref 5.0–8.0)

## 2016-10-06 LAB — LIPASE, BLOOD: Lipase: 17 U/L (ref 11–51)

## 2016-10-06 LAB — COMPREHENSIVE METABOLIC PANEL
ALBUMIN: 5.2 g/dL — AB (ref 3.5–5.0)
ALK PHOS: 74 U/L (ref 38–126)
ALT: 28 U/L (ref 17–63)
AST: 42 U/L — ABNORMAL HIGH (ref 15–41)
Anion gap: 11 (ref 5–15)
BUN: 13 mg/dL (ref 6–20)
CALCIUM: 9.4 mg/dL (ref 8.9–10.3)
CO2: 24 mmol/L (ref 22–32)
CREATININE: 1.05 mg/dL (ref 0.61–1.24)
Chloride: 103 mmol/L (ref 101–111)
GFR calc non Af Amer: >60 mL/min (ref >60)
GLUCOSE: 163 mg/dL — AB (ref 65–99)
Potassium: 4.8 mmol/L (ref 3.5–5.1)
SODIUM: 138 mmol/L (ref 135–145)
Total Bilirubin: 1.5 mg/dL — ABNORMAL HIGH (ref 0.3–1.2)
Total Protein: 7.9 g/dL (ref 6.5–8.1)

## 2016-10-06 MED ORDER — ONDANSETRON HCL 4 MG/2ML IJ SOLN
4.0000 mg | Freq: Once | INTRAMUSCULAR | Status: AC
  Administered 2016-10-06: 4 mg via INTRAVENOUS
  Filled 2016-10-06: qty 2

## 2016-10-06 MED ORDER — SODIUM CHLORIDE 0.9 % IV BOLUS (SEPSIS)
1000.0000 mL | Freq: Once | INTRAVENOUS | Status: AC
  Administered 2016-10-06: 1000 mL via INTRAVENOUS

## 2016-10-06 MED ORDER — DICYCLOMINE HCL 20 MG PO TABS: 20.0000 mg | ORAL_TABLET | Freq: Three times a day (TID) | ORAL | 0 refills | Status: AC | PRN

## 2016-10-06 MED ORDER — PROMETHAZINE HCL 25 MG PO TABS: 25.0000 mg | ORAL_TABLET | Freq: Three times a day (TID) | ORAL | 0 refills | Status: DC | PRN

## 2016-10-06 MED ORDER — FAMOTIDINE IN NACL 20-0.9 MG/50ML-% IV SOLN
20.0000 mg | Freq: Once | INTRAVENOUS | Status: AC
  Administered 2016-10-06: 20 mg via INTRAVENOUS
  Filled 2016-10-06: qty 50

## 2016-10-06 MED ORDER — DIPHENHYDRAMINE HCL 50 MG/ML IJ SOLN
25.0000 mg | Freq: Once | INTRAMUSCULAR | Status: AC
  Administered 2016-10-06: 25 mg via INTRAVENOUS
  Filled 2016-10-06: qty 1

## 2016-10-06 MED ORDER — LORAZEPAM 2 MG/ML IJ SOLN
1.0000 mg | Freq: Once | INTRAMUSCULAR | Status: AC
  Administered 2016-10-06: 1 mg via INTRAVENOUS
  Filled 2016-10-06: qty 1

## 2016-10-06 MED ORDER — HALOPERIDOL LACTATE 5 MG/ML IJ SOLN
5.0000 mg | Freq: Once | INTRAMUSCULAR | Status: AC
  Administered 2016-10-06: 5 mg via INTRAMUSCULAR
  Filled 2016-10-06: qty 1

## 2016-10-06 NOTE — ED Notes (Signed)
Patient was alert, oriented and stable upon discharge. RN went over AVS and patient had no further questions.  Patient was informed not to drive for 4-6 on phenergan or after this visit because of the sedating effects of medications given.

## 2016-10-06 NOTE — ED Notes (Signed)
ED Provider at bedside. 

## 2016-10-06 NOTE — ED Triage Notes (Signed)
HYSTERICAL patient, screaming, cannot sit still c/o mid abd pain since this morning with vomiting.  Pt vomiting in triage.

## 2016-10-06 NOTE — ED Notes (Signed)
PT MADE AWARE OF THE NEED FOR A URINE SAMPLE. 

## 2016-10-06 NOTE — ED Provider Notes (Signed)
[facility]-EMERGENCY DEPT Provider Note   CSN: [id number] Arrival date & time: 10/06/16  1124     History   Chief Complaint Chief Complaint  Patient presents with  . Abdominal Pain    HPI Blake Kerr is a 41 y.o. male.  The history is provided by the patient. No language interpreter was used.  Abdominal Pain     Blake Kerr is a 41 y.o. male who presents to the Emergency Department complaining of abdominal pain . Symptoms started this morning with abrupt onset severe diffuse abdominal pain with uncontrolled emesis. He has associated diaphoresis. He has a history of multiple prior episodes in the past with no clear diagnosis. He denies any fevers, diarrhea, dysuria. He does endorse smoking marijuana, no other drug use. Past Medical History:  Diagnosis Date  . GERD (gastroesophageal reflux disease)     Patient Active Problem List   Diagnosis Date Noted  . Intractable nausea and vomiting 04/24/2016  . Sepsis (H[facility]) 04/23/2016  . Nausea vomiting and diarrhea 02/28/2015    Past Surgical History:  Procedure Laterality Date  . ESOPHAGOGASTRODUODENOSCOPY Bilateral 03/02/2015   Procedure: ESOPHAGOGASTRODUODENOSCOPY (EGD);  Surgeon: Wallace CullensPaul Y Oh, MD;  [facility] END[facility]COPY;  Service: Gastroenterology;  Laterality: Bilateral;       Home Medications    Prior to Admission medications   Medication Sig Start Date End Date Taking? Authorizing Provider  diphenhydrAMINE (BENADRYL) 25 mg capsule Take 2 capsules (50 mg total) by mouth every 6 (six) hours as needed. Patient taking differently: Take 50 mg by mouth every 6 (six) hours as needed for allergies.  08/11/16  Yes Sharman CheekPhillip Stafford, MD  metoCLOPramide (REGLAN) 10 MG tablet Take 1 tablet (10 mg total) by mouth every 6 (six) hours as needed. Patient taking differently: Take 10 mg by mouth every 6 (six) hours as needed. Nausea/vomitting 08/11/16  Yes Sharman CheekPhillip Stafford, MD  omeprazole (PRILOSEC) 20 MG capsule Take 1 capsule  (20 mg total) by mouth 2 (two) times daily before a meal. Patient taking differently: Take 20 mg by mouth 2 (two) times daily as needed (heartburn/acid reflux).  04/24/16  Yes Enedina FinnerSona Patel, MD  ciprofloxacin (CIPRO) 500 MG tablet Take 1 tablet (500 mg total) by mouth 2 (two) times daily. Patient not taking: Reported on 10/06/2016 08/11/16   Sharman CheekPhillip Stafford, MD  dicyclomine (BENTYL) 20 MG tablet Take 1 tablet (20 mg total) by mouth 3 (three) times daily as needed for spasms. 10/06/16   Tilden FossaElizabeth Leiam Hopwood, MD  ketorolac (ACULAR) 0.5 % ophthalmic solution Place 1 drop into the right eye 4 (four) times daily. Patient not taking: Reported on 08/13/2016 08/09/16   Delorise RoyalsJonathan D Cuthriell, PA-C  metroNIDAZOLE (FLAGYL) 500 MG tablet Take 1 tablet (500 mg total) by mouth 3 (three) times daily. Patient not taking: Reported on 10/06/2016 08/11/16   Sharman CheekPhillip Stafford, MD  promethazine (PHENERGAN) 25 MG tablet Take 1 tablet (25 mg total) by mouth every 8 (eight) hours as needed for nausea or vomiting. 10/06/16   Tilden FossaElizabeth Brance Dartt, MD    Family History Family History  Problem Relation Age of Onset  . Hypertension Mother   . Hypertension Father   . Hyperlipidemia Father     Social History Social History  Substance Use Topics  . Smoking status: Current Every Day Smoker    Packs/day: 0.50    Types: Cigarettes  . Smokeless tobacco: Never Used  . Alcohol use 8.4 oz/week    14 Cans of beer per week     Allergies  Patient has no known allergies.   Review of Systems Review of Systems  Gastrointestinal: Positive for abdominal pain.  All other systems reviewed and are negative.    Physical Exam Updated Vital Signs BP 144/61 (BP Location: Right Arm)   Pulse 77   Temp 98.5 F (36.9 C) (Oral)   Resp 18   Ht 6\' 1"  (1.854 m)   Wt 187 lb (84.8 kg)   SpO2 96%   BMI 24.67 kg/m   Physical Exam  Constitutional: He is oriented to person, place, and time. He appears well-developed and well-nourished. He appears  distressed.  Writhing on stretcher  HENT:  Head: Normocephalic and atraumatic.  Cardiovascular: Normal rate and regular rhythm.   No murmur heard. Pulmonary/Chest: Effort normal and breath sounds normal. No respiratory distress.  Abdominal: Soft. There is no rebound and no guarding.  Decreased bowel sounds. Mild diffuse abdominal tenderness.  Musculoskeletal: He exhibits no edema or tenderness.  Neurological: He is alert and oriented to person, place, and time.  Skin: Skin is warm. He is diaphoretic.  Psychiatric:  Anxious  Nursing note and vitals reviewed.    ED Treatments / Results  Labs (all labs ordered are listed, but only abnormal results are displayed) Labs Reviewed  COMPREHENSIVE METABOLIC PANEL - Abnormal; Notable for the following:       Result Value   Glucose, Bld 163 (*)    Albumin 5.2 (*)    AST 42 (*)    Total Bilirubin 1.5 (*)    All other components within normal limits  CBC WITH DIFFERENTIAL/PLATELET - Abnormal; Notable for the following:    WBC 13.1 (*)    Neutro Abs 11.2 (*)    All other components within normal limits  URINALYSIS, ROUTINE W REFLEX MICROSCOPIC - Abnormal; Notable for the following:    Ketones, ur 5 (*)    All other components within normal limits  LIPASE, BLOOD    EKG  EKG Interpretation None       Radiology No results found.  Procedures Procedures (including critical care time)  Medications Ordered in ED Medications  sodium chloride 0.9 % bolus 1,000 mL (0 mLs Intravenous Stopped 10/06/16 1318)  haloperidol lactate (HALDOL) injection 5 mg (5 mg Intramuscular Given 10/06/16 1148)  diphenhydrAMINE (BENADRYL) injection 25 mg (25 mg Intravenous Given 10/06/16 1148)  LORazepam (ATIVAN) injection 1 mg (1 mg Intravenous Given 10/06/16 1148)  ondansetron (ZOFRAN) injection 4 mg (4 mg Intravenous Given 10/06/16 1148)  sodium chloride 0.9 % bolus 1,000 mL (0 mLs Intravenous Stopped 10/06/16 1611)  famotidine (PEPCID) IVPB 20 mg premix  (0 mg Intravenous Stopped 10/06/16 1612)  ondansetron (ZOFRAN) injection 4 mg (4 mg Intravenous Given 10/06/16 1501)     Initial Impression / Assessment and Plan / ED Course  I have reviewed the triage vital signs and the nursing notes.  Pertinent labs & imaging results that were available during my care of the patient were reviewed by me and considered in my medical decision making (see chart for details).     Patient with history of recurrent vomiting episodes here with nausea, vomiting, abdominal pain. He was in severe distress on ED arrival but repeat assessment he is feeling improved with decreased nausea. Clinical picture is not consistent with perforation, bowel obstruction, appendicitis, cholecystitis. Discussed with patient liquid diet, fluid hydration, PCP/GI follow-up.  Final Clinical Impressions(s) / ED Diagnoses   Final diagnoses:  Non-intractable cyclical vomiting with nausea    New Prescriptions Discharge Medication List as of  10/06/2016  3:19 PM    START taking these medications   Details  dicyclomine (BENTYL) 20 MG tablet Take 1 tablet (20 mg total) by mouth 3 (three) times daily as needed for spasms., Starting Wed 10/06/2016, Print    promethazine (PHENERGAN) 25 MG tablet Take 1 tablet (25 mg total) by mouth every 8 (eight) hours as needed for nausea or vomiting., Starting Wed 10/06/2016, Print         Tilden Fossa, MD 10/07/16 [phone]

## 2016-10-21 ENCOUNTER — Emergency Department
Admission: EM | Admit: 2016-10-21 | Discharge: 2016-10-21 | Disposition: A | Discharge status: Home / Self Care | Payer: Self-pay | Attending: Emergency Medicine | Admitting: Emergency Medicine

## 2016-10-21 DIAGNOSIS — R1084 Generalized abdominal pain: Secondary | ICD-10-CM | POA: Insufficient documentation

## 2016-10-21 DIAGNOSIS — Z79899 Other long term (current) drug therapy: Secondary | ICD-10-CM | POA: Insufficient documentation

## 2016-10-21 DIAGNOSIS — R1115 Cyclical vomiting syndrome unrelated to migraine: Secondary | ICD-10-CM

## 2016-10-21 DIAGNOSIS — G43A0 Cyclical vomiting, not intractable: Secondary | ICD-10-CM | POA: Insufficient documentation

## 2016-10-21 DIAGNOSIS — F17210 Nicotine dependence, cigarettes, uncomplicated: Secondary | ICD-10-CM | POA: Insufficient documentation

## 2016-10-21 LAB — CBC
HCT: 46.8 % (ref 40.0–52.0)
Hemoglobin: 16.3 g/dL (ref 13.0–18.0)
MCH: 32.6 pg (ref 26.0–34.0)
MCHC: 34.9 g/dL (ref 32.0–36.0)
MCV: 93.4 fL (ref 80.0–100.0)
PLATELETS: 417 10*3/uL (ref 150–440)
RBC: 5.01 MIL/uL (ref 4.40–5.90)
RDW: 12.9 % (ref 11.5–14.5)
WBC: 22.5 10*3/uL — ABNORMAL HIGH (ref 3.8–10.6)

## 2016-10-21 LAB — COMPREHENSIVE METABOLIC PANEL
ALBUMIN: 5.6 g/dL — AB (ref 3.5–5.0)
ALK PHOS: 71 U/L (ref 38–126)
ALT: 30 U/L (ref 17–63)
AST: 41 U/L (ref 15–41)
Anion gap: 15 (ref 5–15)
BUN: 13 mg/dL (ref 6–20)
CALCIUM: 10.3 mg/dL (ref 8.9–10.3)
CHLORIDE: 102 mmol/L (ref 101–111)
CO2: 23 mmol/L (ref 22–32)
CREATININE: 0.98 mg/dL (ref 0.61–1.24)
GFR calc non Af Amer: >60 mL/min (ref >60)
GLUCOSE: 190 mg/dL — AB (ref 65–99)
Potassium: 4.4 mmol/L (ref 3.5–5.1)
SODIUM: 140 mmol/L (ref 135–145)
Total Bilirubin: 0.7 mg/dL (ref 0.3–1.2)
Total Protein: 8.5 g/dL — ABNORMAL HIGH (ref 6.5–8.1)

## 2016-10-21 LAB — LIPASE, BLOOD: LIPASE: 20 U/L (ref 11–51)

## 2016-10-21 MED ORDER — ONDANSETRON 4 MG PO TBDP
ORAL_TABLET | ORAL | Status: AC
  Administered 2016-10-21: 8 mg via ORAL
  Filled 2016-10-21: qty 2

## 2016-10-21 MED ORDER — PROMETHAZINE HCL 25 MG/ML IJ SOLN
25.0000 mg | Freq: Once | INTRAMUSCULAR | Status: AC
  Administered 2016-10-21: 25 mg via INTRAMUSCULAR

## 2016-10-21 MED ORDER — ONDANSETRON HCL 4 MG/2ML IJ SOLN
4.0000 mg | Freq: Once | INTRAMUSCULAR | Status: AC | PRN
  Administered 2016-10-21: 4 mg via INTRAVENOUS
  Filled 2016-10-21: qty 2

## 2016-10-21 MED ORDER — KETOROLAC TROMETHAMINE 30 MG/ML IJ SOLN
15.0000 mg | INTRAMUSCULAR | Status: AC
  Administered 2016-10-21: 15 mg via INTRAVENOUS
  Filled 2016-10-21: qty 1

## 2016-10-21 MED ORDER — FAMOTIDINE 20 MG PO TABS: 20.0000 mg | ORAL_TABLET | Freq: Two times a day (BID) | ORAL | 0 refills | Status: AC

## 2016-10-21 MED ORDER — HALOPERIDOL LACTATE 5 MG/ML IJ SOLN
5.0000 mg | Freq: Once | INTRAMUSCULAR | Status: AC
  Administered 2016-10-21: 5 mg via INTRAVENOUS
  Filled 2016-10-21: qty 1

## 2016-10-21 MED ORDER — DIPHENHYDRAMINE HCL 25 MG PO CAPS: 50.0000 mg | ORAL_CAPSULE | Freq: Four times a day (QID) | ORAL | 0 refills | Status: AC | PRN

## 2016-10-21 MED ORDER — PROMETHAZINE HCL 25 MG/ML IJ SOLN
INTRAMUSCULAR | Status: AC
  Administered 2016-10-21: 25 mg via INTRAMUSCULAR
  Filled 2016-10-21: qty 1

## 2016-10-21 MED ORDER — ONDANSETRON 4 MG PO TBDP
8.0000 mg | ORAL_TABLET | Freq: Once | ORAL | Status: AC
  Administered 2016-10-21: 8 mg via ORAL

## 2016-10-21 MED ORDER — DIPHENHYDRAMINE HCL 50 MG/ML IJ SOLN
25.0000 mg | Freq: Once | INTRAMUSCULAR | Status: AC
  Administered 2016-10-21: 25 mg via INTRAVENOUS
  Filled 2016-10-21: qty 1

## 2016-10-21 MED ORDER — SODIUM CHLORIDE 0.9 % IV BOLUS (SEPSIS)
1000.0000 mL | Freq: Once | INTRAVENOUS | Status: AC
  Administered 2016-10-21: 1000 mL via INTRAVENOUS

## 2016-10-21 NOTE — ED Provider Notes (Signed)
[facility] Emergency Department Provider Note  ____________________________________________  Time seen: Approximately 9:10 PM  I have reviewed the triage vital signs and the nursing notes.   HISTORY  Chief Complaint Emesis    HPI Blake HUBBERT is a 41 y.o. male who complains of generalized abdominal pain that started at 6 AM today accompanied by vomiting. He and his sister reports that this has happened multiple times in the past, associated with marijuana use. he does not answer whether he has been using marijuana recently.  Denies any trauma. No fever. No dysuria.     Past Medical History:  Diagnosis Date  . GERD (gastroesophageal reflux disease)      Patient Active Problem List   Diagnosis Date Noted  . Intractable nausea and vomiting 04/24/2016  . Sepsis (HCC) 04/23/2016  . Nausea vomiting and diarrhea 02/28/2015     Past Surgical History:  Procedure Laterality Date  . ESOPHAGOGASTRODUODENOSCOPY Bilateral 03/02/2015   Procedure: ESOPHAGOGASTRODUODENOSCOPY (EGD);  Surgeon: Wallace Cullens, MD;  Location: [facility] ENDOSCOPY;  Service: Gastroenterology;  Laterality: Bilateral;     Prior to Admission medications   Medication Sig Start Date End Date Taking? Authorizing Provider  omeprazole (PRILOSEC) 20 MG capsule Take 1 capsule (20 mg total) by mouth 2 (two) times daily before a meal. Patient taking differently: Take 20 mg by mouth 2 (two) times daily as needed (heartburn/acid reflux).  04/24/16  Yes Enedina Finner, MD  dicyclomine (BENTYL) 20 MG tablet Take 1 tablet (20 mg total) by mouth 3 (three) times daily as needed for spasms. Patient not taking: Reported on 10/21/2016 10/06/16   Tilden Fossa, MD  diphenhydrAMINE (BENADRYL) 25 mg capsule Take 2 capsules (50 mg total) by mouth every 6 (six) hours as needed. 10/21/16   Sharman Cheek, MD  famotidine (PEPCID) 20 MG tablet Take 1 tablet (20 mg total) by mouth 2 (two) times daily. 10/21/16   Sharman Cheek, MD  ketorolac (ACULAR) 0.5 % ophthalmic solution Place 1 drop into the right eye 4 (four) times daily. Patient not taking: Reported on 08/13/2016 08/09/16   Delorise Royals Cuthriell, PA-C  metoCLOPramide (REGLAN) 10 MG tablet Take 1 tablet (10 mg total) by mouth every 6 (six) hours as needed. Patient not taking: Reported on 10/21/2016 08/11/16   Sharman Cheek, MD  metroNIDAZOLE (FLAGYL) 500 MG tablet Take 1 tablet (500 mg total) by mouth 3 (three) times daily. Patient not taking: Reported on 10/06/2016 08/11/16   Sharman Cheek, MD  promethazine (PHENERGAN) 25 MG tablet Take 1 tablet (25 mg total) by mouth every 8 (eight) hours as needed for nausea or vomiting. 10/06/16   Tilden Fossa, MD     Allergies Patient has no known allergies.   Family History  Problem Relation Age of Onset  . Hypertension Mother   . Hypertension Father   . Hyperlipidemia Father     Social History Social History  Substance Use Topics  . Smoking status: Current Every Day Smoker    Packs/day: 0.50    Types: Cigarettes  . Smokeless tobacco: Never Used  . Alcohol use 8.4 oz/week    14 Cans of beer per week    Review of Systems  Constitutional:   No fever or chills.  ENT:   No sore throat. No rhinorrhea. Cardiovascular:   No chest pain. Respiratory:   No dyspnea or cough. Gastrointestinal:   Positive generalized abdominal pain with vomiting. No constipation..  Genitourinary:   Negative for dysuria or difficulty urinating. Musculoskeletal:  Negative for focal pain or swelling Neurological:   Negative for headaches 10-point ROS otherwise negative.  ____________________________________________   PHYSICAL EXAM:  VITAL SIGNS: ED Triage Vitals  Enc Vitals Group     BP 10/21/16 1604 (!) 139/119     Pulse Rate 10/21/16 1604 99     Resp 10/21/16 1604 (!) 28     Temp --      Temp src --      SpO2 10/21/16 1604 99 %     Weight --      Height --      Head Circumference --      Peak Flow --       Pain Score 10/21/16 1606 10     Pain Loc --      Pain Edu? --      Excl. in GC? --     Vital signs reviewed, nursing assessments reviewed.   Constitutional:   Alert and oriented. Uncomfortable. Eyes:   No scleral icterus. No conjunctival pallor. PERRL. EOMI.  No nystagmus. ENT   Head:   Normocephalic and atraumatic.   Nose:   No congestion/rhinnorhea. No septal hematoma   Mouth/Throat:   MMM, no pharyngeal erythema. No peritonsillar mass.    Neck:   No stridor. No SubQ emphysema. No meningismus. Hematological/Lymphatic/Immunilogical:   No cervical lymphadenopathy. Cardiovascular:   RRR. Symmetric bilateral radial and DP pulses.  No murmurs.  Respiratory:   Normal respiratory effort without tachypnea nor retractions. Breath sounds are clear and equal bilaterally. No wheezes/rales/rhonchi. Gastrointestinal:   Soft and nontender. Non distended. There is no CVA tenderness.  No rebound, rigidity, or guarding. Genitourinary:   deferred Musculoskeletal:   Nontender with normal range of motion in all extremities. No joint effusions.  No lower extremity tenderness.  No edema. Neurologic:   Normal speech and language.  CN 2-10 normal. Motor grossly intact. No gross focal neurologic deficits are appreciated.  Skin:    Skin is warm, dry and intact. No rash noted.  No petechiae, purpura, or bullae.  ____________________________________________    LABS (pertinent positives/negatives) (all labs ordered are listed, but only abnormal results are displayed) Labs Reviewed  COMPREHENSIVE METABOLIC PANEL - Abnormal; Notable for the following:       Result Value   Glucose, Bld 190 (*)    Total Protein 8.5 (*)    Albumin 5.6 (*)    All other components within normal limits  CBC - Abnormal; Notable for the following:    WBC 22.5 (*)    All other components within normal limits  LIPASE, BLOOD  URINALYSIS, COMPLETE (UACMP) WITH MICROSCOPIC    ____________________________________________   EKG    ____________________________________________    RADIOLOGY    ____________________________________________   PROCEDURES Procedures  ____________________________________________   INITIAL IMPRESSION / ASSESSMENT AND PLAN / ED COURSE  Pertinent labs & imaging results that were available during my care of the patient were reviewed by me and considered in my medical decision making (see chart for details).  Patient presents with generalized abdominal pain and vomiting. After giving the patient Haldol for nausea control, Benadryl, Toradol, he reports that his symptoms have essentially resolved. He is lying comfortably in bed. Abdominal exam was able to be completed at that time which was unremarkable. Abdomen is benign and nonsurgical.Considering the patient's symptoms, medical history, and physical examination today, I have low suspicion for cholecystitis or biliary pathology, pancreatitis, perforation or bowel obstruction, hernia, intra-abdominal abscess, AAA or dissection, volvulus or intussusception, mesenteric ischemia,  or appendicitis.  Advised patient to avoid marijuana. Continue omeprazole. Add H2 blocker for a week. Benadryl as well. Follow-up with primary care.       ____________________________________________   FINAL CLINICAL IMPRESSION(S) / ED DIAGNOSES  Final diagnoses:  Non-intractable cyclical vomiting with nausea      New Prescriptions   DIPHENHYDRAMINE (BENADRYL) 25 MG CAPSULE    Take 2 capsules (50 mg total) by mouth every 6 (six) hours as needed.   FAMOTIDINE (PEPCID) 20 MG TABLET    Take 1 tablet (20 mg total) by mouth 2 (two) times daily.     Portions of this note were generated with dragon dictation software. Dictation errors may occur despite best attempts at proofreading.    Sharman CheekPhillip Levetta Bognar, MD 10/21/16 2112

## 2016-10-21 NOTE — ED Notes (Addendum)
Pt back to room via wheelchair, yelling out in pain. PT c/o abdominal pain 10/10 since 6AM today. PT nauseated. Pt hx of similar pain approx every month.   Pt vomited on floor.

## 2016-10-21 NOTE — ED Notes (Signed)
ED Provider at bedside to re-assess.  ?

## 2016-10-21 NOTE — ED Notes (Signed)
Pt sleeping on stomach, family at bedside.

## 2016-10-21 NOTE — ED Notes (Signed)
ED Provider at bedside. 

## 2016-10-21 NOTE — ED Notes (Addendum)
Pt now resting much more comfortably in bed, oxygen placed on patient via nasal cannular, 2L. Pt states pain is much better, easily aroused by verbal stimuli.    Pt follows verbal commands to turn and move in bed. Pain upon palpation to midi abdomen.

## 2016-10-21 NOTE — ED Triage Notes (Signed)
Per sister pt started vomiting early this AM constantly. Pt writhing around in wheelchair. Vomiting and dry heaving in triage. Still has abd organs.

## 2016-11-02 ENCOUNTER — Emergency Department: Payer: Self-pay

## 2016-11-02 ENCOUNTER — Emergency Department
Admission: EM | Admit: 2016-11-02 | Discharge: 2016-11-02 | Disposition: A | Discharge status: Home / Self Care | Payer: Self-pay | Attending: Emergency Medicine | Admitting: Emergency Medicine

## 2016-11-02 DIAGNOSIS — G8929 Other chronic pain: Secondary | ICD-10-CM | POA: Insufficient documentation

## 2016-11-02 DIAGNOSIS — Z791 Long term (current) use of non-steroidal anti-inflammatories (NSAID): Secondary | ICD-10-CM | POA: Insufficient documentation

## 2016-11-02 DIAGNOSIS — F17210 Nicotine dependence, cigarettes, uncomplicated: Secondary | ICD-10-CM | POA: Insufficient documentation

## 2016-11-02 DIAGNOSIS — R197 Diarrhea, unspecified: Secondary | ICD-10-CM | POA: Insufficient documentation

## 2016-11-02 DIAGNOSIS — R112 Nausea with vomiting, unspecified: Secondary | ICD-10-CM | POA: Insufficient documentation

## 2016-11-02 DIAGNOSIS — Z79899 Other long term (current) drug therapy: Secondary | ICD-10-CM | POA: Insufficient documentation

## 2016-11-02 DIAGNOSIS — R6889 Other general symptoms and signs: Secondary | ICD-10-CM

## 2016-11-02 DIAGNOSIS — R109 Unspecified abdominal pain: Secondary | ICD-10-CM

## 2016-11-02 LAB — COMPREHENSIVE METABOLIC PANEL
ALT: 26 U/L (ref 17–63)
ANION GAP: 13 (ref 5–15)
AST: 28 U/L (ref 15–41)
Albumin: 4.7 g/dL (ref 3.5–5.0)
Alkaline Phosphatase: 67 U/L (ref 38–126)
BILIRUBIN TOTAL: 0.5 mg/dL (ref 0.3–1.2)
BUN: 15 mg/dL (ref 6–20)
CALCIUM: 9 mg/dL (ref 8.9–10.3)
CO2: 22 mmol/L (ref 22–32)
Chloride: 98 mmol/L — ABNORMAL LOW (ref 101–111)
Creatinine, Ser: 1.21 mg/dL (ref 0.61–1.24)
GFR calc non Af Amer: >60 mL/min (ref >60)
Glucose, Bld: 136 mg/dL — ABNORMAL HIGH (ref 65–99)
POTASSIUM: 4 mmol/L (ref 3.5–5.1)
SODIUM: 133 mmol/L — AB (ref 135–145)
TOTAL PROTEIN: 8.1 g/dL (ref 6.5–8.1)

## 2016-11-02 LAB — CBC
HCT: 46.7 % (ref 40.0–52.0)
HEMOGLOBIN: 16.5 g/dL (ref 13.0–18.0)
MCH: 32.3 pg (ref 26.0–34.0)
MCHC: 35.3 g/dL (ref 32.0–36.0)
MCV: 91.6 fL (ref 80.0–100.0)
Platelets: 190 10*3/uL (ref 150–440)
RBC: 5.1 MIL/uL (ref 4.40–5.90)
RDW: 12.7 % (ref 11.5–14.5)
WBC: 13.6 10*3/uL — ABNORMAL HIGH (ref 3.8–10.6)

## 2016-11-02 LAB — TROPONIN I

## 2016-11-02 LAB — LIPASE, BLOOD: Lipase: 16 U/L (ref 11–51)

## 2016-11-02 MED ORDER — DIPHENHYDRAMINE HCL 50 MG/ML IJ SOLN
25.0000 mg | INTRAMUSCULAR | Status: AC
  Administered 2016-11-02: 25 mg via INTRAVENOUS
  Filled 2016-11-02: qty 1

## 2016-11-02 MED ORDER — SODIUM CHLORIDE 0.9 % IV BOLUS (SEPSIS)
1000.0000 mL | INTRAVENOUS | Status: AC
  Administered 2016-11-02: 1000 mL via INTRAVENOUS

## 2016-11-02 MED ORDER — HALOPERIDOL LACTATE 5 MG/ML IJ SOLN
2.5000 mg | Freq: Once | INTRAMUSCULAR | Status: AC
Start: 2016-11-02 — End: 2016-11-02
  Administered 2016-11-02: 2.5 mg via INTRAVENOUS
  Filled 2016-11-02: qty 1

## 2016-11-02 MED ORDER — ONDANSETRON 4 MG PO TBDP: ORAL_TABLET | ORAL | 0 refills | Status: DC

## 2016-11-02 NOTE — Discharge Instructions (Signed)
As we discussed, your workup today was reassuring.  Though we do not know exactly what is causing your symptoms, it appears that you have no emergent medical condition at this time and that you are safe to go home and follow up as recommended in this paperwork.  Given that you have frequent similar episodes, you should strongly consider completely quitting marijuana, because it is likely you suffer from a condition called cannabinoid hyperemesis syndrome which results in symptoms exactly like you are experiencing.  Please return immediately to the Emergency Department if you develop any new or worsening symptoms that concern you.

## 2016-11-02 NOTE — ED Notes (Signed)
Pt unable to urinate at this moment.  

## 2016-11-02 NOTE — ED Triage Notes (Signed)
Pt moaning and rocking on arrival, pt c/o generalized bodyaches with N/V/D/fever since Friday..Marland Kitchen

## 2016-11-02 NOTE — ED Provider Notes (Signed)
[facility] Emergency Department Provider Note  ____________________________________________   First MD Initiated Contact with Patient 11/02/16 1353     (approximate)  I have reviewed the triage vital signs and the nursing notes.   HISTORY  Chief Complaint Emesis; Diarrhea; and Generalized Body Aches  History limited by patient's moaning and a general lack of cooperation  HPI Blake Kerr is a 41 y.o. male with a history of heavy marijuana use, frequent emergency department visits for abdominal pain, nausea, vomiting, and diarrhea, and acid reflux, who presents complaining of generalized body aches, nausea, vomiting, diarrhea, and subjective fever for the last 5 days.  He states it is severe and he is moaning, rocking back and forth, and minimally cooperative with exam and history.  This is similar to his prior presentations taste on her review of the EMR.  He states it feels like there is pressure on his chest and "I think it might be the flu."  He states his symptoms are severe and nothing is making them better.  He denies heavy use of marijuana but then admitted to still smoking daily, just less than before.  He has a history of gastritis and has been admitted to [facility] in the past for hematemesis.  He also has a history of heavy alcohol use although it is unclear if he is Still drinking.  Past Medical History:  Diagnosis Date  . GERD (gastroesophageal reflux disease)     Patient Active Problem List   Diagnosis Date Noted  . Intractable nausea and vomiting 04/24/2016  . Sepsis (HCC) 04/23/2016  . Nausea vomiting and diarrhea 02/28/2015    Past Surgical History:  Procedure Laterality Date  . ESOPHAGOGASTRODUODENOSCOPY Bilateral 03/02/2015   Procedure: ESOPHAGOGASTRODUODENOSCOPY (EGD);  Surgeon: Wallace Cullens, MD;  Location: [facility] ENDOSCOPY;  Service: Gastroenterology;  Laterality: Bilateral;    Prior to Admission medications   Medication Sig Start Date  End Date Taking? Authorizing Provider  dicyclomine (BENTYL) 20 MG tablet Take 1 tablet (20 mg total) by mouth 3 (three) times daily as needed for spasms. Patient not taking: Reported on 10/21/2016 10/06/16   Tilden Fossa, MD  diphenhydrAMINE (BENADRYL) 25 mg capsule Take 2 capsules (50 mg total) by mouth every 6 (six) hours as needed. 10/21/16   Sharman Cheek, MD  famotidine (PEPCID) 20 MG tablet Take 1 tablet (20 mg total) by mouth 2 (two) times daily. 10/21/16   Sharman Cheek, MD  ketorolac (ACULAR) 0.5 % ophthalmic solution Place 1 drop into the right eye 4 (four) times daily. Patient not taking: Reported on 08/13/2016 08/09/16   Delorise Royals Cuthriell, PA-C  metoCLOPramide (REGLAN) 10 MG tablet Take 1 tablet (10 mg total) by mouth every 6 (six) hours as needed. Patient not taking: Reported on 10/21/2016 08/11/16   Sharman Cheek, MD  metroNIDAZOLE (FLAGYL) 500 MG tablet Take 1 tablet (500 mg total) by mouth 3 (three) times daily. Patient not taking: Reported on 10/06/2016 08/11/16   Sharman Cheek, MD  omeprazole (PRILOSEC) 20 MG capsule Take 1 capsule (20 mg total) by mouth 2 (two) times daily before a meal. Patient taking differently: Take 20 mg by mouth 2 (two) times daily as needed (heartburn/acid reflux).  04/24/16   Enedina Finner, MD  ondansetron (ZOFRAN ODT) 4 MG disintegrating tablet Allow 1-2 tablets to dissolve in your mouth every 8 hours as needed for nausea/vomiting 11/02/16   Loleta Rose, MD  promethazine (PHENERGAN) 25 MG tablet Take 1 tablet (25 mg total) by mouth every  8 (eight) hours as needed for nausea or vomiting. 10/06/16   Tilden Fossa, MD    Allergies Patient has no known allergies.  Family History  Problem Relation Age of Onset  . Hypertension Mother   . Hypertension Father   . Hyperlipidemia Father     Social History Social History  Substance Use Topics  . Smoking status: Current Every Day Smoker    Packs/day: 0.50    Types: Cigarettes  . Smokeless tobacco:  Never Used  . Alcohol use 8.4 oz/week    14 Cans of beer per week    Review of Systems Level 5 caveat:  unable to obtain secondary to patient's cooperation, moaning, rocking back and forth ____________________________________________   PHYSICAL EXAM:  VITAL SIGNS: ED Triage Vitals  Enc Vitals Group     BP 11/02/16 1321 102/61     Pulse Rate 11/02/16 1321 82     Resp 11/02/16 1321 20     Temp 11/02/16 1321 98.6 F (37 C)     Temp Source 11/02/16 1321 Oral     SpO2 11/02/16 1321 95 %     Weight 11/02/16 1312 185 lb (83.9 kg)     Height 11/02/16 1312 6\' 1"  (1.854 m)     Head Circumference --      Peak Flow --      Pain Score 11/02/16 1312 10     Pain Loc --      Pain Edu? --      Excl. in GC? --     Constitutional: Alert and oriented, appears very uncomfortable, moaning Eyes: Conjunctivae are normal. PERRL. EOMI. Head: Atraumatic. Nose: No congestion/rhinnorhea. Mouth/Throat: Mucous membranes are moist. Neck: No stridor.  No meningeal signs.   Cardiovascular: Normal rate, regular rhythm. Good peripheral circulation. Grossly normal heart sounds. Respiratory: Normal respiratory effort.  No retractions. Lungs CTAB. Gastrointestinal: Soft with diffuse mild tenderness throughout.  Palpating his abdomen does not change the frequency nor volume of his moaning. Musculoskeletal: No lower extremity tenderness nor edema. No gross deformities of extremities. Neurologic:  Normal speech and language. No gross focal neurologic deficits are appreciated.  Skin:  Skin is warm, dry and intact. No rash noted.   ____________________________________________   LABS (all labs ordered are listed, but only abnormal results are displayed)  Labs Reviewed  COMPREHENSIVE METABOLIC PANEL - Abnormal; Notable for the following:       Result Value   Sodium 133 (*)    Chloride 98 (*)    Glucose, Bld 136 (*)    All other components within normal limits  CBC - Abnormal; Notable for the following:      WBC 13.6 (*)    All other components within normal limits  LIPASE, BLOOD  TROPONIN I  URINALYSIS, COMPLETE (UACMP) WITH MICROSCOPIC   ____________________________________________  EKG  ED ECG REPORT I, Temesha Queener, the attending physician, personally viewed and interpreted this ECG.  Date: 11/02/2016 EKG Time: 2:06 PM Rate: 97 Rhythm: normal sinus rhythm QRS Axis: Right axis deviation Intervals: normal ST/T Wave abnormalities: normal Conduction Disturbances: none Narrative Interpretation: unremarkable  ____________________________________________  RADIOLOGY   Dg Chest Portable 1 View  Result Date: 11/02/2016 CLINICAL DATA:  Generalized body aches, nausea, vomiting, diarrhea and fever for 4 days. Gastritis and hematemesis. EXAM: PORTABLE CHEST 1 VIEW COMPARISON:  Chest radiograph February 27, 2015 FINDINGS: Cardiomediastinal silhouette is normal. No pleural effusions or focal consolidations. Mild hyperinflation and mild bronchitic changes. Trachea projects midline and there is no pneumothorax. Soft  tissue planes and included osseous structures are non-suspicious. IMPRESSION: Mild hyperinflation and bronchitic changes. No intraperitoneal free air on this portable radiograph. Electronically Signed   By: Awilda Metroourtnay  Bloomer M.D.   On: 11/02/2016 14:54    ____________________________________________   PROCEDURES  Procedure(s) performed:   Procedures   Critical Care performed: No ____________________________________________   INITIAL IMPRESSION / ASSESSMENT AND PLAN / ED COURSE  Pertinent labs & imaging results that were available during my care of the patient were reviewed by me and considered in my medical decision making (see chart for details).  The patient's presentation is extremely similar to multiple prior visits to the ED although he claims that the symptoms are different.  Given his history of gastritis, alcohol abuse, marijuana abuse, and a prior admission for  hematemesis (which she denies this time), I will evaluate with an upright chest x-ray to look for free air, in addition to adding on a troponin.  I am also going to add on an ethanol level.  There is no point in checking a urine tox screen since he admits to drug use.  I suspect he is having symptoms of cannabinoid hyperemesis syndrome as he has in the prior.  Both at this hospital and at another emergency department, Haldol and Benadryl were very successful in treating his symptoms and I will do the same, starting with Haldol 2.5 mg IV and Benadryl 25 mg IV, but I will increase the dose as needed for symptomatic improvement as we continue with the workup.   Clinical Course as of Nov 02 1706  Tue Nov 02, 2016  1647 The patient has been sleeping soundly since my initial evaluation.  I woke him up to give him the results of his studies.  He does state that he feels better and asked if he has the flu.  I explained we are no longer testing for influenza except in select populations, but pointed out it will not change management.  I also brought up the possibility of cannabinoid hyperemesis syndrome and encouraged him to completely stop smoking marijuana.  I told him to get rest, take Tylenol as needed, and follow up with his doctor.  I gave my usual and customary return precautions.  He agrees with the plan.  [CF]  1650 Of note, while he has a mild leukocytosis, it is considerably lower than his last visit to the ED, which is reassuring.  [CF]    Clinical Course User Index [CF] Loleta Roseory Nijah Tejera, MD    ____________________________________________  FINAL CLINICAL IMPRESSION(S) / ED DIAGNOSES  Final diagnoses:  Chronic abdominal pain  Nausea vomiting and diarrhea  Flu-like symptoms     MEDICATIONS GIVEN DURING THIS VISIT:  Medications  haloperidol lactate (HALDOL) injection 2.5 mg (2.5 mg Intravenous Given 11/02/16 1425)  diphenhydrAMINE (BENADRYL) injection 25 mg (25 mg Intravenous Given 11/02/16  1424)  sodium chloride 0.9 % bolus 1,000 mL (0 mLs Intravenous Stopped 11/02/16 1704)     NEW OUTPATIENT MEDICATIONS STARTED DURING THIS VISIT:  New Prescriptions   ONDANSETRON (ZOFRAN ODT) 4 MG DISINTEGRATING TABLET    Allow 1-2 tablets to dissolve in your mouth every 8 hours as needed for nausea/vomiting    Modified Medications   No medications on file    Discontinued Medications   No medications on file     Note:  This document was prepared using Dragon voice recognition software and may include unintentional dictation errors.    Loleta Roseory Clester Chlebowski, MD 11/02/16 [phone]

## 2016-12-25 ENCOUNTER — Emergency Department
Admission: EM | Admit: 2016-12-25 | Discharge: 2016-12-25 | Disposition: A | Discharge status: Home / Self Care | Payer: Self-pay | Attending: Emergency Medicine | Admitting: Emergency Medicine

## 2016-12-25 ENCOUNTER — Emergency Department: Payer: Self-pay

## 2016-12-25 ENCOUNTER — Encounter: Payer: Self-pay | Admitting: Emergency Medicine

## 2016-12-25 DIAGNOSIS — E860 Dehydration: Secondary | ICD-10-CM | POA: Insufficient documentation

## 2016-12-25 DIAGNOSIS — F17210 Nicotine dependence, cigarettes, uncomplicated: Secondary | ICD-10-CM | POA: Insufficient documentation

## 2016-12-25 DIAGNOSIS — Z79899 Other long term (current) drug therapy: Secondary | ICD-10-CM | POA: Insufficient documentation

## 2016-12-25 DIAGNOSIS — R112 Nausea with vomiting, unspecified: Secondary | ICD-10-CM

## 2016-12-25 DIAGNOSIS — E871 Hypo-osmolality and hyponatremia: Secondary | ICD-10-CM | POA: Insufficient documentation

## 2016-12-25 LAB — CBC WITH DIFFERENTIAL/PLATELET
BASOS ABS: 0 10*3/uL (ref 0–0.1)
Basophils Relative: 0 %
EOS PCT: 0 %
Eosinophils Absolute: 0 10*3/uL (ref 0–0.7)
HCT: 47.1 % (ref 40.0–52.0)
HEMOGLOBIN: 16.5 g/dL (ref 13.0–18.0)
Lymphocytes Relative: 17 %
Lymphs Abs: 2.4 10*3/uL (ref 1.0–3.6)
MCH: 32.2 pg (ref 26.0–34.0)
MCHC: 35 g/dL (ref 32.0–36.0)
MCV: 91.8 fL (ref 80.0–100.0)
Monocytes Absolute: 1.4 10*3/uL — ABNORMAL HIGH (ref 0.2–1.0)
Monocytes Relative: 10 %
NEUTROS PCT: 73 %
Neutro Abs: 10 10*3/uL — ABNORMAL HIGH (ref 1.4–6.5)
PLATELETS: 297 10*3/uL (ref 150–440)
RBC: 5.12 MIL/uL (ref 4.40–5.90)
RDW: 13 % (ref 11.5–14.5)
WBC: 13.9 10*3/uL — AB (ref 3.8–10.6)

## 2016-12-25 LAB — COMPREHENSIVE METABOLIC PANEL
ALBUMIN: 5.2 g/dL — AB (ref 3.5–5.0)
ALT: 19 U/L (ref 17–63)
AST: 25 U/L (ref 15–41)
Alkaline Phosphatase: 66 U/L (ref 38–126)
Anion gap: 13 (ref 5–15)
BUN: 45 mg/dL — AB (ref 6–20)
CHLORIDE: 87 mmol/L — AB (ref 101–111)
CO2: 29 mmol/L (ref 22–32)
CREATININE: 1.35 mg/dL — AB (ref 0.61–1.24)
Calcium: 9.3 mg/dL (ref 8.9–10.3)
GFR calc Af Amer: >60 mL/min (ref >60)
GFR calc non Af Amer: >60 mL/min (ref >60)
GLUCOSE: 118 mg/dL — AB (ref 65–99)
Potassium: 3.4 mmol/L — ABNORMAL LOW (ref 3.5–5.1)
Sodium: 129 mmol/L — ABNORMAL LOW (ref 135–145)
Total Bilirubin: 1.7 mg/dL — ABNORMAL HIGH (ref 0.3–1.2)
Total Protein: 8.4 g/dL — ABNORMAL HIGH (ref 6.5–8.1)

## 2016-12-25 LAB — LIPASE, BLOOD: Lipase: 16 U/L (ref 11–51)

## 2016-12-25 LAB — ETHANOL

## 2016-12-25 MED ORDER — PROMETHAZINE HCL 25 MG/ML IJ SOLN
12.5000 mg | Freq: Once | INTRAMUSCULAR | Status: AC
  Administered 2016-12-25: 12.5 mg via INTRAVENOUS
  Filled 2016-12-25: qty 1

## 2016-12-25 MED ORDER — DIPHENHYDRAMINE HCL 50 MG/ML IJ SOLN
25.0000 mg | Freq: Once | INTRAMUSCULAR | Status: AC
  Administered 2016-12-25: 25 mg via INTRAVENOUS
  Filled 2016-12-25: qty 1

## 2016-12-25 MED ORDER — CAPSAICIN 0.075 % EX CREA: TOPICAL_CREAM | Freq: Once | CUTANEOUS | Status: DC

## 2016-12-25 MED ORDER — HALOPERIDOL LACTATE 5 MG/ML IJ SOLN
2.0000 mg | Freq: Once | INTRAMUSCULAR | Status: AC
  Administered 2016-12-25: 2 mg via INTRAVENOUS
  Filled 2016-12-25: qty 1

## 2016-12-25 MED ORDER — SODIUM CHLORIDE 0.9 % IV BOLUS (SEPSIS)
1000.0000 mL | Freq: Once | INTRAVENOUS | Status: AC
  Administered 2016-12-25: 1000 mL via INTRAVENOUS

## 2016-12-25 MED ORDER — CAPSICUM OLEORESIN 0.025 % EX CREA
TOPICAL_CREAM | Freq: Once | CUTANEOUS | Status: AC
  Administered 2016-12-25: 1 via TOPICAL
  Filled 2016-12-25: qty 60

## 2016-12-25 NOTE — ED Notes (Signed)
Pt left prior to receiving D/C instructions. NAD noted at time of patient leaving, ambulatory without difficulty, respirations even and unlabored, skin warm, dry, and intact.

## 2016-12-25 NOTE — ED Notes (Signed)
Megan RN stated to pt in waiting room "you cannot drive after being given narcotics"  Pt aggressively stated "I already called my sister.  I am not going to drive"

## 2016-12-25 NOTE — ED Provider Notes (Signed)
[facility] Emergency Department Provider Note  ____________________________________________  Time seen: Approximately 7:18 AM  I have reviewed the triage vital signs and the nursing notes.   HISTORY  Chief Complaint Abdominal Pain and Emesis   HPI Blake Kerr is a 41 y.o. male with a history of marijuana abuse, alcohol abuse, and frequent ER visits for cannabinoid hyperemesis who presents for evaluation of acute on chronic abdominal pain, nausea or vomiting. Patient reports for the last 4 days has been unable to keep anything down and has had several daily episodes of nonbloody nonbilious emesis. Also complaining of severe nausea and diffuse crampy intermittent non radiating abdominal pain. No diarrhea or fevers. Has not had a bowel movement in 3 days. No prior abdominal surgeries. No fever or chills, dysuria hematuria. Patient denies drinking alcohol since getting sick. His last marijuana was before this episode started. Denies any other drug use. No chest pain or shortness of breath.  Past Medical History:  Diagnosis Date  . GERD (gastroesophageal reflux disease)     Patient Active Problem List   Diagnosis Date Noted  . Intractable nausea and vomiting 04/24/2016  . Sepsis (HCC) 04/23/2016  . Nausea vomiting and diarrhea 02/28/2015    Past Surgical History:  Procedure Laterality Date  . ESOPHAGOGASTRODUODENOSCOPY Bilateral 03/02/2015   Procedure: ESOPHAGOGASTRODUODENOSCOPY (EGD);  Surgeon: Wallace Cullens, MD;  Location: [facility] ENDOSCOPY;  Service: Gastroenterology;  Laterality: Bilateral;    Prior to Admission medications   Medication Sig Start Date End Date Taking? Authorizing Provider  dicyclomine (BENTYL) 20 MG tablet Take 1 tablet (20 mg total) by mouth 3 (three) times daily as needed for spasms. Patient not taking: Reported on 10/21/2016 10/06/16   Tilden Fossa, MD  diphenhydrAMINE (BENADRYL) 25 mg capsule Take 2 capsules (50 mg total) by mouth  every 6 (six) hours as needed. 10/21/16   Sharman Cheek, MD  famotidine (PEPCID) 20 MG tablet Take 1 tablet (20 mg total) by mouth 2 (two) times daily. 10/21/16   Sharman Cheek, MD  ketorolac (ACULAR) 0.5 % ophthalmic solution Place 1 drop into the right eye 4 (four) times daily. Patient not taking: Reported on 08/13/2016 08/09/16   Delorise Royals Cuthriell, PA-C  metoCLOPramide (REGLAN) 10 MG tablet Take 1 tablet (10 mg total) by mouth every 6 (six) hours as needed. Patient not taking: Reported on 10/21/2016 08/11/16   Sharman Cheek, MD  metroNIDAZOLE (FLAGYL) 500 MG tablet Take 1 tablet (500 mg total) by mouth 3 (three) times daily. Patient not taking: Reported on 10/06/2016 08/11/16   Sharman Cheek, MD  omeprazole (PRILOSEC) 20 MG capsule Take 1 capsule (20 mg total) by mouth 2 (two) times daily before a meal. Patient taking differently: Take 20 mg by mouth 2 (two) times daily as needed (heartburn/acid reflux).  04/24/16   Enedina Finner, MD  ondansetron (ZOFRAN ODT) 4 MG disintegrating tablet Allow 1-2 tablets to dissolve in your mouth every 8 hours as needed for nausea/vomiting 11/02/16   Loleta Rose, MD  promethazine (PHENERGAN) 25 MG tablet Take 1 tablet (25 mg total) by mouth every 8 (eight) hours as needed for nausea or vomiting. 10/06/16   Tilden Fossa, MD    Allergies Patient has no known allergies.  Family History  Problem Relation Age of Onset  . Hypertension Mother   . Hypertension Father   . Hyperlipidemia Father     Social History Social History  Substance Use Topics  . Smoking status: Current Every Day Smoker  Packs/day: 0.50    Types: Cigarettes  . Smokeless tobacco: Never Used  . Alcohol use 8.4 oz/week    14 Cans of beer per week    Review of Systems  Constitutional: Negative for fever. Eyes: Negative for visual changes. ENT: Negative for sore throat. Neck: No neck pain  Cardiovascular: Negative for chest pain. Respiratory: Negative for shortness of  breath. Gastrointestinal: + abdominal cramps and vomiting. No diarrhea. Genitourinary: Negative for dysuria. Musculoskeletal: Negative for back pain. Skin: Negative for rash. Neurological: Negative for headaches, weakness or numbness. Psych: No SI or HI  ____________________________________________   PHYSICAL EXAM:  VITAL SIGNS: ED Triage Vitals [12/25/16 0706]  Enc Vitals Group     BP 120/72     Pulse Rate (!) 109     Resp 20     Temp 97.2 F (36.2 C)     Temp Source Oral     SpO2 100 %     Weight 180 lb (81.6 kg)     Height  (1.854 m)     Head Circumference      Peak Flow      Pain Score 7     Pain Loc      Pain Edu?      Excl. in GC?     Constitutional: Alert and oriented, groaning and holding his abdomen.  HEENT:      Head: Normocephalic and atraumatic.         Eyes: Conjunctivae are normal. Sclera is non-icteric. EOMI. PERRL      Mouth/Throat: Mucous membranes are moist.       Neck: Supple with no signs of meningismus. Cardiovascular: Tachycardic with regular rhythm. No murmurs, gallops, or rubs. 2+ symmetrical distal pulses are present in all extremities. No JVD. Respiratory: Normal respiratory effort. Lungs are clear to auscultation bilaterally. No wheezes, crackles, or rhonchi.  Gastrointestinal: Soft, non tender, and non distended with positive bowel sounds. No rebound or guarding. Genitourinary: No CVA tenderness. Musculoskeletal: Nontender with normal range of motion in all extremities. No edema, cyanosis, or erythema of extremities. Neurologic: Normal speech and language. Face is symmetric. Moving all extremities. No gross focal neurologic deficits are appreciated. Skin: Skin is warm, dry and intact. No rash noted. Psychiatric: Mood and affect are normal. Speech and behavior are normal.  ____________________________________________   LABS (all labs ordered are listed, but only abnormal results are displayed)  Labs Reviewed  CBC WITH  DIFFERENTIAL/PLATELET - Abnormal; Notable for the following:       Result Value   WBC 13.9 (*)    Neutro Abs 10.0 (*)    Monocytes Absolute 1.4 (*)    All other components within normal limits  COMPREHENSIVE METABOLIC PANEL - Abnormal; Notable for the following:    Sodium 129 (*)    Potassium 3.4 (*)    Chloride 87 (*)    Glucose, Bld 118 (*)    BUN 45 (*)    Creatinine, Ser 1.35 (*)    Total Protein 8.4 (*)    Albumin 5.2 (*)    Total Bilirubin 1.7 (*)    All other components within normal limits  LIPASE, BLOOD  ETHANOL   ____________________________________________  EKG  none ____________________________________________  RADIOLOGY  KUB: 1. Nonobstructive bowel gas pattern. 2. No pneumoperitoneum.  ____________________________________________   PROCEDURES  Procedure(s) performed: None Procedures Critical Care performed:  None ____________________________________________   INITIAL IMPRESSION / ASSESSMENT AND PLAN / ED COURSE  41 y.o. male with a history of marijuana  abuse, alcohol abuse, and frequent ER visits for cannabinoid hyperemesis who presents for evaluation of acute on chronic abdominal pain, nausea or vomiting. Patient's symptoms started 4 days ago which according to patient was last time he used marijuana and drinks alcohol. Patient's vitals show afebrile, normotensive, mildly tachycardic however patient is very agitated in the room. His abdomen is soft and when distracted does not seem to have any tenderness on palpation. Seems like patient in the past has responded well to Haldol and Benadryl. Will give phenergan, haldol, benadryl, IVF. Will apply capsaicin cream to abdomen to help with cannabinoid hyperemesis.   Clinical Course as of Dec 25 1516  Sat Dec 25, 2016  4098 Patient removed his own IV and said he was ready to go. He said he is feeling better and no longer vomiting. I explained to him that he had evidence of dehydration based on his blood work  and also low sodium. I recommended that he stay for repeat BMP to make sure number were improving after IV fluids however patient says that his ride is here and he wishes to go home. He'll follow-up with his PCP. Patient was discharged. Patient instructed not to drive, tells me his sister is here to pick him up  [CV]    Clinical Course User Index [CV] Nita Sickle, MD    Pertinent labs & imaging results that were available during my care of the patient were reviewed by me and considered in my medical decision making (see chart for details).    ____________________________________________   FINAL CLINICAL IMPRESSION(S) / ED DIAGNOSES  Final diagnoses:  Non-intractable vomiting with nausea, unspecified vomiting type  Dehydration  Hyponatremia      NEW MEDICATIONS STARTED DURING THIS VISIT:  Discharge Medication List as of 12/25/2016  9:13 AM       Note:  This document was prepared using Dragon voice recognition software and may include unintentional dictation errors.    Nita Sickle, MD 12/25/16 [id number]

## 2016-12-25 NOTE — ED Notes (Signed)
Patient transported to X-ray 

## 2016-12-25 NOTE — ED Notes (Signed)
This RN witnessed patient walking towards the lobby with IV to R hand. This RN asked patient where he was going with IV, pt states to the lobby. This RN explained to patient that he could not leave patient care area with IV in place and escorted patient back to his room. This RN notified Vikki Ports, Primary RN and Dr. Don Perking that patient was wanting to leave. Dr. Don Perking states patient is okay for D/C. This RN removed IV from R hand, bandage in place to cover it. Per Vikki Ports, RN pt had received Benadryl, Haldol, and Phenergan. This RN explained repeatedly to patient, both in the room and in the lobby, that he was not allowed to drive for a minimum of 4 hrs after receiving those medications and could result in criminal charges. Pt states "my sister is in the lobby". This RN attempted to have patient call sister back to room, pt states, "I'm not driving, I'm leaving, why would I call her back here if I'm leaving?" This RN explained for safe discharge purposes that a sober ride was needed, pt then began to walk towards the lobby. This RN went with patient to the lobby to visualize his sister, pt initially went and sat outside, then went and sat in his car, then proceeded to drive off. This RN attempted to contact [employer] officer without success prior to patient driving off in his car. Gearldine Bienenstock, RN notified of situation, Officer Verdeck notified of situation, and registration notified of situation.

## 2016-12-25 NOTE — ED Notes (Signed)
Officer aware of patient elopement.

## 2018-08-23 ENCOUNTER — Emergency Department
Admission: EM | Admit: 2018-08-23 | Discharge: 2018-08-23 | Disposition: A | Discharge status: Home / Self Care | Payer: Self-pay | Attending: Student in an Organized Health Care Education/Training Program | Admitting: Student in an Organized Health Care Education/Training Program

## 2018-08-23 ENCOUNTER — Encounter: Payer: Self-pay | Admitting: Emergency Medicine

## 2018-08-23 DIAGNOSIS — R1084 Generalized abdominal pain: Secondary | ICD-10-CM

## 2018-08-23 DIAGNOSIS — R109 Unspecified abdominal pain: Secondary | ICD-10-CM | POA: Insufficient documentation

## 2018-08-23 DIAGNOSIS — F17210 Nicotine dependence, cigarettes, uncomplicated: Secondary | ICD-10-CM | POA: Insufficient documentation

## 2018-08-23 LAB — URINALYSIS, COMPLETE (UACMP) WITH MICROSCOPIC
BILIRUBIN URINE: NEGATIVE
Bacteria, UA: NONE SEEN
Glucose, UA: NEGATIVE mg/dL
HGB URINE DIPSTICK: NEGATIVE
Ketones, ur: NEGATIVE mg/dL
Leukocytes, UA: NEGATIVE
Nitrite: NEGATIVE
PROTEIN: NEGATIVE mg/dL
SPECIFIC GRAVITY, URINE: 1.028 (ref 1.005–1.030)
Squamous Epithelial / LPF: NONE SEEN (ref 0–5)
pH: 5 (ref 5.0–8.0)

## 2018-08-23 LAB — COMPREHENSIVE METABOLIC PANEL
ALBUMIN: 4.6 g/dL (ref 3.5–5.0)
ALK PHOS: 62 U/L (ref 38–126)
ALT: 21 U/L (ref 0–44)
AST: 25 U/L (ref 15–41)
Anion gap: 11 (ref 5–15)
BUN: 12 mg/dL (ref 6–20)
CALCIUM: 9.2 mg/dL (ref 8.9–10.3)
CO2: 26 mmol/L (ref 22–32)
CREATININE: 0.92 mg/dL (ref 0.61–1.24)
Chloride: 103 mmol/L (ref 98–111)
GFR calc non Af Amer: >60 mL/min (ref >60)
GLUCOSE: 149 mg/dL — AB (ref 70–99)
Potassium: 3.9 mmol/L (ref 3.5–5.1)
SODIUM: 140 mmol/L (ref 135–145)
Total Bilirubin: 0.8 mg/dL (ref 0.3–1.2)
Total Protein: 7.5 g/dL (ref 6.5–8.1)

## 2018-08-23 LAB — CBC
HCT: 43 % (ref 39.0–52.0)
Hemoglobin: 14.7 g/dL (ref 13.0–17.0)
MCH: 32 pg (ref 26.0–34.0)
MCHC: 34.2 g/dL (ref 30.0–36.0)
MCV: 93.5 fL (ref 80.0–100.0)
Platelets: 299 10*3/uL (ref 150–400)
RBC: 4.6 MIL/uL (ref 4.22–5.81)
RDW: 12 % (ref 11.5–15.5)
WBC: 9.8 10*3/uL (ref 4.0–10.5)
nRBC: 0 % (ref 0.0–0.2)

## 2018-08-23 LAB — LIPASE, BLOOD: Lipase: 23 U/L (ref 11–51)

## 2018-08-23 MED ORDER — HALOPERIDOL 2 MG PO TABS
2.0000 mg | ORAL_TABLET | Freq: Once | ORAL | Status: AC
  Administered 2018-08-23: 2 mg via ORAL
  Filled 2018-08-23: qty 1

## 2018-08-23 MED ORDER — PROMETHAZINE HCL 25 MG PO TABS: 25.0000 mg | ORAL_TABLET | Freq: Three times a day (TID) | ORAL | 0 refills | Status: DC | PRN

## 2018-08-23 MED ORDER — CAPSAICIN 0.025 % EX CREA
TOPICAL_CREAM | Freq: Two times a day (BID) | CUTANEOUS | 0 refills | Status: AC
Start: 2018-08-23 — End: ?

## 2018-08-23 MED ORDER — PROMETHAZINE HCL 25 MG PO TABS
12.5000 mg | ORAL_TABLET | Freq: Once | ORAL | Status: AC
  Administered 2018-08-23: 12.5 mg via ORAL
  Filled 2018-08-23: qty 1

## 2018-08-23 NOTE — ED Notes (Signed)
AAOx3.  Skin warm and dry.  States [facility] recently for same complaints  Was to follow up with GI and schedule a colonoscopy, but was told he needed another GI referral.  States has recurrent episodes of abdominal pain and vomiting.

## 2018-08-23 NOTE — ED Triage Notes (Signed)
Patient presents to ED via POV from home with c/o abdominal pain and vomiting. Patient reports he is not able to keep anything down. Patient reports emesis began on Monday.

## 2018-08-23 NOTE — ED Provider Notes (Signed)
[facility] Emergency Department Provider Note    First MD Initiated Contact with Patient 08/23/18 1403     (approximate)  I have reviewed the triage vital signs and the nursing notes.   HISTORY  Chief Complaint Abdominal Pain    HPI Blake Kerr is a 42 y.o. male history of reflux who [facility] with a history of polysubstance abuse as well as alcohol and marijuana use presents the ER with several intermittent episodes of cyclic vomiting.  States that he will have episodes where he is unable to keep anything down for days.  States he started feeling sick on Monday.  He does get better with hot showers.  He is able to tolerate liquids.  Denies any fevers.  No chest pain or shortness of breath.   Past Medical History:  Diagnosis Date  . GERD (gastroesophageal reflux disease)    Family History  Problem Relation Age of Onset  . Hypertension Mother   . Hypertension Father   . Hyperlipidemia Father    Past Surgical History:  Procedure Laterality Date  . ESOPHAGOGASTRODUODENOSCOPY Bilateral 03/02/2015   Procedure: ESOPHAGOGASTRODUODENOSCOPY (EGD);  Surgeon: Wallace CullensPaul Y Oh, MD;  [facility] ENDOSCOPY;  Service: Gastroenterology;  Laterality: Bilateral;   Patient Active Problem List   Diagnosis Date Noted  . Intractable nausea and vomiting 04/24/2016  . Sepsis (HCC) 04/23/2016  . Nausea vomiting and diarrhea 02/28/2015      Prior to Admission medications   Medication Sig Start Date End Date Taking? Authorizing Provider  capsaicin (ZOSTRIX) 0.025 % cream Apply topically 2 (two) times daily. Apply to upper stomach region 08/23/18   Willy Eddyobinson, Yetta Marceaux, MD  dicyclomine (BENTYL) 20 MG tablet Take 1 tablet (20 mg total) by mouth 3 (three) times daily as needed for spasms. Patient not taking: Reported on 10/21/2016 10/06/16   Tilden Fossaees, Elizabeth, MD  diphenhydrAMINE (BENADRYL) 25 mg capsule Take 2 capsules (50 mg total) by mouth every 6  (six) hours as needed. 10/21/16   Sharman CheekStafford, Phillip, MD  famotidine (PEPCID) 20 MG tablet Take 1 tablet (20 mg total) by mouth 2 (two) times daily. 10/21/16   Sharman CheekStafford, Phillip, MD  ketorolac (ACULAR) 0.5 % ophthalmic solution Place 1 drop into the right eye 4 (four) times daily. Patient not taking: Reported on 08/13/2016 08/09/16   Cuthriell, Delorise RoyalsJonathan D, PA-C  metoCLOPramide (REGLAN) 10 MG tablet Take 1 tablet (10 mg total) by mouth every 6 (six) hours as needed. Patient not taking: Reported on 10/21/2016 08/11/16   Sharman CheekStafford, Phillip, MD  metroNIDAZOLE (FLAGYL) 500 MG tablet Take 1 tablet (500 mg total) by mouth 3 (three) times daily. Patient not taking: Reported on 10/06/2016 08/11/16   Sharman CheekStafford, Phillip, MD  omeprazole (PRILOSEC) 20 MG capsule Take 1 capsule (20 mg total) by mouth 2 (two) times daily before a meal. Patient taking differently: Take 20 mg by mouth 2 (two) times daily as needed (heartburn/acid reflux).  04/24/16   Enedina FinnerPatel, Sona, MD  ondansetron (ZOFRAN ODT) 4 MG disintegrating tablet Allow 1-2 tablets to dissolve in your mouth every 8 hours as needed for nausea/vomiting 11/02/16   Loleta RoseForbach, Cory, MD  promethazine (PHENERGAN) 25 MG tablet Take 1 tablet (25 mg total) by mouth every 8 (eight) hours as needed for nausea or vomiting. 08/23/18   Willy Eddyobinson, Caspar Favila, MD    Allergies Patient has no known allergies.    Social History Social History   Tobacco Use  . Smoking status: Current Every Day Smoker  Packs/day: 0.50    Types: Cigarettes  . Smokeless tobacco: Never Used  Substance Use Topics  . Alcohol use: Yes    Alcohol/week: 3.0 standard drinks    Types: 3 Cans of beer per week  . Drug use: Yes    Types: Marijuana    Review of Systems Patient denies headaches, rhinorrhea, blurry vision, numbness, shortness of breath, chest pain, edema, cough, abdominal pain, nausea, vomiting, diarrhea, dysuria, fevers, rashes or hallucinations unless otherwise stated above in  HPI. ____________________________________________   PHYSICAL EXAM:  VITAL SIGNS: Vitals:   08/23/18 1318  BP: 139/83  Pulse: 72  Resp: 17  Temp: 97.7 F (36.5 C)  SpO2: 99%    Constitutional: Alert and oriented.  Eyes: Conjunctivae are normal.  Head: Atraumatic. Nose: No congestion/rhinnorhea. Mouth/Throat: Mucous membranes are moist.   Neck: No stridor. Painless ROM.  Cardiovascular: Normal rate, regular rhythm. Grossly normal heart sounds.  Good peripheral circulation. Respiratory: Normal respiratory effort.  No retractions. Lungs CTAB. Gastrointestinal: Soft and nontender. No distention. No abdominal bruits. No CVA tenderness. Genitourinary:  Musculoskeletal: No lower extremity tenderness nor edema.  No joint effusions. Neurologic:  Normal speech and language. No gross focal neurologic deficits are appreciated. No facial droop Skin:  Skin is warm, dry and intact. No rash noted. Psychiatric: Mood and affect are normal. Speech and behavior are normal.  ____________________________________________   LABS (all labs ordered are listed, but only abnormal results are displayed)  Results for orders placed or performed during the hospital encounter of 08/23/18 (from the past 24 hour(s))  Lipase, blood     Status: None   Collection Time: 08/23/18  1:15 PM  Result Value Ref Range   Lipase 23 11 - 51 U/L  Comprehensive metabolic panel     Status: Abnormal   Collection Time: 08/23/18  1:15 PM  Result Value Ref Range   Sodium 140 135 - 145 mmol/L   Potassium 3.9 3.5 - 5.1 mmol/L   Chloride 103 98 - 111 mmol/L   CO2 26 22 - 32 mmol/L   Glucose, Bld 149 (H) 70 - 99 mg/dL   BUN 12 6 - 20 mg/dL   Creatinine, Ser 1.61 0.61 - 1.24 mg/dL   Calcium 9.2 8.9 - 09.6 mg/dL   Total Protein 7.5 6.5 - 8.1 g/dL   Albumin 4.6 3.5 - 5.0 g/dL   AST 25 15 - 41 U/L   ALT 21 0 - 44 U/L   Alkaline Phosphatase 62 38 - 126 U/L   Total Bilirubin 0.8 0.3 - 1.2 mg/dL   GFR calc non Af Amer >60  >60 mL/min   GFR calc Af Amer >60 >60 mL/min   Anion gap 11 5 - 15  CBC     Status: None   Collection Time: 08/23/18  1:15 PM  Result Value Ref Range   WBC 9.8 4.0 - 10.5 K/uL   RBC 4.60 4.22 - 5.81 MIL/uL   Hemoglobin 14.7 13.0 - 17.0 g/dL   HCT 04.5 40.9 - 81.1 %   MCV 93.5 80.0 - 100.0 fL   MCH 32.0 26.0 - 34.0 pg   MCHC 34.2 30.0 - 36.0 g/dL   RDW 91.4 78.2 - 95.6 %   Platelets 299 150 - 400 K/uL   nRBC 0.0 0.0 - 0.2 %  Urinalysis, Complete w Microscopic     Status: Abnormal   Collection Time: 08/23/18  1:15 PM  Result Value Ref Range   Color, Urine YELLOW (A) YELLOW  APPearance HAZY (A) CLEAR   Specific Gravity, Urine 1.028 1.005 - 1.030   pH 5.0 5.0 - 8.0   Glucose, UA NEGATIVE NEGATIVE mg/dL   Hgb urine dipstick NEGATIVE NEGATIVE   Bilirubin Urine NEGATIVE NEGATIVE   Ketones, ur NEGATIVE NEGATIVE mg/dL   Protein, ur NEGATIVE NEGATIVE mg/dL   Nitrite NEGATIVE NEGATIVE   Leukocytes, UA NEGATIVE NEGATIVE   RBC / HPF 0-5 0 - 5 RBC/hpf   WBC, UA 0-5 0 - 5 WBC/hpf   Bacteria, UA NONE SEEN NONE SEEN   Squamous Epithelial / LPF NONE SEEN 0 - 5   Mucus PRESENT    ____________________________________________ __________________________________  RADIOLOGY   ____________________________________________   PROCEDURES  Procedure(s) performed:  Procedures    Critical Care performed: no ____________________________________________   INITIAL IMPRESSION / ASSESSMENT AND PLAN / ED COURSE  Pertinent labs & imaging results that were available during my care of the patient were reviewed by me and considered in my medical decision making (see chart for details).   DDX: Hyperemesis cannabinoid syndrome, enteritis, pancreatitis, gastritis, colitis, hepatitis, cyclic vomiting syndrome  Blake Kerr is a 42 y.o. who presents to the ED with symptoms as described above.  Seems most clinically consistent with some form of cyclic vomiting syndrome.  His blood work is  reassuring.  No acidosis.  His abdominal exam is soft and benign.  Will give trial dose of low-dose oral Haldol as this is very effective for nausea as well as patients with hyperemesis cannabinoid syndrome which have a high suspicion given his improvement in symptoms with warm showers.  Will also give Phenergan.  Patient requesting referral back to [facility].  I do not see any indication for further diagnostic testing at this time as he is tolerating oral hydration and medications.  Stable and appropriate for outpatient follow-up.      As part of my medical decision making, I reviewed the following data within the electronic MEDICAL RECORD NUMBER Nursing notes reviewed and incorporated, Labs reviewed, notes from prior ED visits.   ____________________________________________   FINAL CLINICAL IMPRESSION(S) / ED DIAGNOSES  Final diagnoses:  Generalized abdominal pain      NEW MEDICATIONS STARTED DURING THIS VISIT:  New Prescriptions   CAPSAICIN (ZOSTRIX) 0.025 % CREAM    Apply topically 2 (two) times daily. Apply to upper stomach region     Note:  This document was prepared using Dragon voice recognition software and may include unintentional dictation errors.    Willy Eddy, MD 08/23/18 1447

## 2018-08-23 NOTE — Discharge Instructions (Addendum)

## 2018-08-23 NOTE — ED Notes (Signed)
AAOx3.  Skin warm and dry.  NAD 

## 2018-09-28 ENCOUNTER — Emergency Department
Admission: EM | Admit: 2018-09-28 | Discharge: 2018-09-28 | Disposition: A | Discharge status: Home / Self Care | Payer: Self-pay | Attending: Emergency Medicine | Admitting: Emergency Medicine

## 2018-09-28 ENCOUNTER — Other Ambulatory Visit: Payer: Self-pay

## 2018-09-28 ENCOUNTER — Encounter: Payer: Self-pay | Admitting: Emergency Medicine

## 2018-09-28 DIAGNOSIS — R1115 Cyclical vomiting syndrome unrelated to migraine: Secondary | ICD-10-CM | POA: Insufficient documentation

## 2018-09-28 DIAGNOSIS — Z79899 Other long term (current) drug therapy: Secondary | ICD-10-CM | POA: Insufficient documentation

## 2018-09-28 DIAGNOSIS — F17210 Nicotine dependence, cigarettes, uncomplicated: Secondary | ICD-10-CM | POA: Insufficient documentation

## 2018-09-28 LAB — COMPREHENSIVE METABOLIC PANEL
ALT: 20 U/L (ref 0–44)
AST: 19 U/L (ref 15–41)
Albumin: 4.2 g/dL (ref 3.5–5.0)
Alkaline Phosphatase: 68 U/L (ref 38–126)
Anion gap: 9 (ref 5–15)
BUN: 11 mg/dL (ref 6–20)
CHLORIDE: 102 mmol/L (ref 98–111)
CO2: 24 mmol/L (ref 22–32)
CREATININE: 0.66 mg/dL (ref 0.61–1.24)
Calcium: 9.1 mg/dL (ref 8.9–10.3)
GFR calc Af Amer: >60 mL/min (ref >60)
Glucose, Bld: 120 mg/dL — ABNORMAL HIGH (ref 70–99)
Potassium: 3.9 mmol/L (ref 3.5–5.1)
Sodium: 135 mmol/L (ref 135–145)
Total Bilirubin: 0.7 mg/dL (ref 0.3–1.2)
Total Protein: 7.5 g/dL (ref 6.5–8.1)

## 2018-09-28 LAB — CBC WITH DIFFERENTIAL/PLATELET
Abs Immature Granulocytes: 0.09 10*3/uL — ABNORMAL HIGH (ref 0.00–0.07)
Basophils Absolute: 0 10*3/uL (ref 0.0–0.1)
Basophils Relative: 0 %
EOS PCT: 0 %
Eosinophils Absolute: 0 10*3/uL (ref 0.0–0.5)
HEMATOCRIT: 41.4 % (ref 39.0–52.0)
HEMOGLOBIN: 14.6 g/dL (ref 13.0–17.0)
Immature Granulocytes: 1 %
LYMPHS ABS: 2.1 10*3/uL (ref 0.7–4.0)
LYMPHS PCT: 14 %
MCH: 31.9 pg (ref 26.0–34.0)
MCHC: 35.3 g/dL (ref 30.0–36.0)
MCV: 90.6 fL (ref 80.0–100.0)
MONO ABS: 1 10*3/uL (ref 0.1–1.0)
Monocytes Relative: 7 %
Neutro Abs: 11.9 10*3/uL — ABNORMAL HIGH (ref 1.7–7.7)
Neutrophils Relative %: 78 %
Platelets: 306 10*3/uL (ref 150–400)
RBC: 4.57 MIL/uL (ref 4.22–5.81)
RDW: 11.9 % (ref 11.5–15.5)
WBC: 15 10*3/uL — AB (ref 4.0–10.5)
nRBC: 0 % (ref 0.0–0.2)

## 2018-09-28 LAB — URINALYSIS, COMPLETE (UACMP) WITH MICROSCOPIC
BACTERIA UA: NONE SEEN
Bilirubin Urine: NEGATIVE
GLUCOSE, UA: NEGATIVE mg/dL
Hgb urine dipstick: NEGATIVE
KETONES UR: NEGATIVE mg/dL
Leukocytes, UA: NEGATIVE
Nitrite: NEGATIVE
PH: 6 (ref 5.0–8.0)
Protein, ur: NEGATIVE mg/dL
SPECIFIC GRAVITY, URINE: 1.028 (ref 1.005–1.030)
WBC UA: NONE SEEN WBC/hpf (ref 0–5)

## 2018-09-28 MED ORDER — PROMETHAZINE HCL 25 MG/ML IJ SOLN
25.0000 mg | Freq: Once | INTRAMUSCULAR | Status: AC
  Administered 2018-09-28: 25 mg via INTRAVENOUS
  Filled 2018-09-28: qty 1

## 2018-09-28 MED ORDER — SODIUM CHLORIDE 0.9 % IV BOLUS
1000.0000 mL | Freq: Once | INTRAVENOUS | Status: AC
  Administered 2018-09-28: 1000 mL via INTRAVENOUS

## 2018-09-28 MED ORDER — MORPHINE SULFATE (PF) 2 MG/ML IV SOLN
2.0000 mg | Freq: Once | INTRAVENOUS | Status: AC
  Administered 2018-09-28: 2 mg via INTRAVENOUS
  Filled 2018-09-28: qty 1

## 2018-09-28 MED ORDER — PROMETHAZINE HCL 12.5 MG PO TABS: 12.5000 mg | ORAL_TABLET | Freq: Four times a day (QID) | ORAL | 0 refills | Status: DC | PRN

## 2018-09-28 NOTE — ED Triage Notes (Signed)
Patient ambulatory to triage with steady gait, without difficulty, appears uncomfortable; reports N/V and lower abd pain x 2 days; pt with hx cyclical vomiting

## 2018-09-28 NOTE — ED Notes (Signed)
ED Provider at bedside. 

## 2018-09-28 NOTE — ED Provider Notes (Signed)
[facility] Emergency Department Provider Note   ____________________________________________    I have reviewed the triage vital signs and the nursing notes.   HISTORY  Chief Complaint Emesis and Abdominal Pain     HPI Blake Kerr is a 43 y.o. 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  He reports abdominal cramping severe nausea and vomiting started yesterday, eased up last night but started again this morning.  Denies fevers or chills.  Reports this is similar to prior episodes.  No diarrhea.  Vomitus is nonbilious nonbloody  Past Medical History:  Diagnosis Date  . GERD (gastroesophageal reflux disease)     Patient Active Problem List   Diagnosis Date Noted  . Intractable nausea and vomiting 04/24/2016  . Sepsis (HCC) 04/23/2016  . Nausea vomiting and diarrhea 02/28/2015    Past Surgical History:  Procedure Laterality Date  . ESOPHAGOGASTRODUODENOSCOPY Bilateral 03/02/2015   Procedure: ESOPHAGOGASTRODUODENOSCOPY (EGD);  Surgeon: Wallace Cullens, MD;  Location: [facility] ENDOSCOPY;  Service: Gastroenterology;  Laterality: Bilateral;    Prior to Admission medications   Medication Sig Start Date End Date Taking? Authorizing Provider  cyclobenzaprine (FLEXERIL) 10 MG tablet Take 10 mg by mouth 2 (two) times daily as needed for muscle spasms. 03/29/16  Yes [provider]  capsaicin (ZOSTRIX) 0.025 % cream Apply topically 2 (two) times daily. Apply to upper stomach region 08/23/18   Willy Eddy, MD  dicyclomine (BENTYL) 20 MG tablet Take 1 tablet (20 mg total) by mouth 3 (three) times daily as needed for spasms. Patient not taking: Reported on 10/21/2016 10/06/16   Tilden Fossa, MD  diphenhydrAMINE (BENADRYL) 25 mg capsule Take 2 capsules (50 mg  total) by mouth every 6 (six) hours as needed. 10/21/16   Sharman Cheek, MD  famotidine (PEPCID) 20 MG tablet Take 1 tablet (20 mg total) by mouth 2 (two) times daily. 10/21/16   Sharman Cheek, MD  ketorolac (ACULAR) 0.5 % ophthalmic solution Place 1 drop into the right eye 4 (four) times daily. Patient not taking: Reported on 08/13/2016 08/09/16   Cuthriell, Delorise Royals, PA-C  ondansetron (ZOFRAN ODT) 4 MG disintegrating tablet Allow 1-2 tablets to dissolve in your mouth every 8 hours as needed for nausea/vomiting 11/02/16   Loleta Rose, MD  promethazine (PHENERGAN) 12.5 MG tablet Take 1 tablet (12.5 mg total) by mouth every 6 (six) hours as needed for nausea or vomiting. 09/28/18   Jene Every, MD     Allergies Patient has no known allergies.  Family History  Problem Relation Age of Onset  . Hypertension Mother   . Hypertension Father   . Hyperlipidemia Father     Social History Social History   Tobacco Use  . Smoking status: Current Every Day Smoker    Packs/day: 0.50    Types: Cigarettes  . Smokeless tobacco: Never Used  Substance Use Topics  . Alcohol use: Yes    Alcohol/week: 3.0 standard drinks    Types: 3 Cans of beer per week  . Drug use: Yes    Types: Marijuana    Review of Systems  Constitutional: No fever/chills Eyes: No visual changes.  ENT: No sore throat. Cardiovascular: Denies chest pain. Respiratory: Denies shortness of breath. Gastrointestinal: As above Genitourinary: Negative for dysuria. Musculoskeletal: Negative for back pain. Skin: Negative for rash. Neurological:  Negative for headaches    ____________________________________________   PHYSICAL EXAM:  VITAL SIGNS: ED Triage Vitals  Enc Vitals Group     BP 09/28/18 0654 (!) 134/91     Pulse Rate 09/28/18 0654 93     Resp 09/28/18 0654 15     Temp 09/28/18 0654 98.3 F (36.8 C)     Temp Source 09/28/18 0654 Oral     SpO2 09/28/18 0654 99 %     Weight 09/28/18 0645 77.1 kg (170  lb)     Height 09/28/18 0645 1.854 m (6\' 1" )     Head Circumference --      Peak Flow --      Pain Score 09/28/18 0645 10     Pain Loc --      Pain Edu? --      Excl. in GC? --     Constitutional: Alert and oriented.  Eyes: Conjunctivae are normal.  Head: Atraumatic.  Mouth/Throat: Mucous membranes are moist.    Cardiovascular: Normal rate, regular rhythm. Grossly normal heart sounds.  Good peripheral circulation. Respiratory: Normal respiratory effort.  No retractions. Lungs CTAB. Gastrointestinal: Soft and nontender. No distention.  No CVA tenderness.  Reassuring exam  Musculoskeletal:   Warm and well perfused Neurologic:  Normal speech and language. No gross focal neurologic deficits are appreciated.  Skin:  Skin is warm, dry and intact. No rash noted. Psychiatric: Mood and affect are normal. Speech and behavior are normal.  ____________________________________________   LABS (all labs ordered are listed, but only abnormal results are displayed)  Labs Reviewed  COMPREHENSIVE METABOLIC PANEL - Abnormal; Notable for the following components:      Result Value   Glucose, Bld 120 (*)    All other components within normal limits  CBC WITH DIFFERENTIAL/PLATELET - Abnormal; Notable for the following components:   WBC 15.0 (*)    Neutro Abs 11.9 (*)    Abs Immature Granulocytes 0.09 (*)    All other components within normal limits  URINALYSIS, COMPLETE (UACMP) WITH MICROSCOPIC - Abnormal; Notable for the following components:   Color, Urine YELLOW (*)    APPearance CLEAR (*)    All other components within normal limits   ____________________________________________  EKG  None ____________________________________________  RADIOLOGY  None ____________________________________________   PROCEDURES  Procedure(s) performed: No  Procedures   Critical Care performed: No ____________________________________________   INITIAL IMPRESSION / ASSESSMENT AND PLAN / ED  COURSE  Pertinent labs & imaging results that were available during my care of the patient were reviewed by me and considered in my medical decision making (see chart for details).  Patient presents with nausea vomiting abdominal cramping.  Symptoms similar to previous episodes of the same, likely consistent with cyclical vomiting syndrome.  Will give IV Phenergan, check labs, give IV fluids and reevaluate.  Review of records demonstrates the patient has had some improvement with Haldol IV in the past, will consider if no improvement with Phenergan   ----------------------------------------- 9:22 AM on 09/28/2018 -----------------------------------------  Patient feeling significantly better.  Lab work is overall unremarkable, elevated white blood cell count likely related to repeat vomiting episodes no indication of infection.   He feels much better and is asking to go home, I feel this is reasonable return precautions discussed    ____________________________________________   FINAL CLINICAL IMPRESSION(S) / ED DIAGNOSES  Final diagnoses:  Cyclic vomiting syndrome        Note:  This document was prepared using Dragon voice recognition software and  may include unintentional dictation errors.   Jene EveryKinner, Mery Guadalupe, MD 09/28/18 1309

## 2018-09-28 NOTE — ED Notes (Signed)
Pt c/o abd pain LLQ  10/10. Pt states he has intemittent episodes of severe abdominal pain for the past year. Pt states he was on [facility] ED on December 2019 with the same chief complaint. Pt states he was told he will get a referral for an colonoscopy from the EDP. Pt states he has called  [facility] to make an appointment for a colonoscopy and he was told he needs a referral from an [facility] EDP because he has no referrals on file.  Pt states "I was told to stop smoking marijuana because that causes this pain in my stomach and also that I was allergic to it but I've been smoking marijuana for years and that it's not what it is causing this pain". Pt denies CP, SOB, no changes in diet or eating/driking habits. VSS. Pt seems uncomfortable and fidgeting around on strectcher. Girlfriend is at bedside.

## 2018-09-29 ENCOUNTER — Other Ambulatory Visit: Payer: Self-pay

## 2018-09-29 ENCOUNTER — Emergency Department
Admission: EM | Admit: 2018-09-29 | Discharge: 2018-09-29 | Disposition: A | Discharge status: Home / Self Care | Payer: Self-pay | Attending: Emergency Medicine | Admitting: Emergency Medicine

## 2018-09-29 ENCOUNTER — Emergency Department: Payer: Self-pay

## 2018-09-29 DIAGNOSIS — R1115 Cyclical vomiting syndrome unrelated to migraine: Secondary | ICD-10-CM

## 2018-09-29 DIAGNOSIS — Z79899 Other long term (current) drug therapy: Secondary | ICD-10-CM | POA: Insufficient documentation

## 2018-09-29 DIAGNOSIS — F17210 Nicotine dependence, cigarettes, uncomplicated: Secondary | ICD-10-CM | POA: Insufficient documentation

## 2018-09-29 DIAGNOSIS — R1033 Periumbilical pain: Secondary | ICD-10-CM | POA: Insufficient documentation

## 2018-09-29 LAB — CBC
HEMATOCRIT: 44.3 % (ref 39.0–52.0)
Hemoglobin: 15.2 g/dL (ref 13.0–17.0)
MCH: 31.3 pg (ref 26.0–34.0)
MCHC: 34.3 g/dL (ref 30.0–36.0)
MCV: 91.3 fL (ref 80.0–100.0)
NRBC: 0 % (ref 0.0–0.2)
Platelets: 346 10*3/uL (ref 150–400)
RBC: 4.85 MIL/uL (ref 4.22–5.81)
RDW: 11.9 % (ref 11.5–15.5)
WBC: 12.5 10*3/uL — ABNORMAL HIGH (ref 4.0–10.5)

## 2018-09-29 LAB — COMPREHENSIVE METABOLIC PANEL
ALK PHOS: 67 U/L (ref 38–126)
ALT: 20 U/L (ref 0–44)
ANION GAP: 9 (ref 5–15)
AST: 22 U/L (ref 15–41)
Albumin: 4.1 g/dL (ref 3.5–5.0)
BILIRUBIN TOTAL: 0.7 mg/dL (ref 0.3–1.2)
BUN: 9 mg/dL (ref 6–20)
CALCIUM: 9 mg/dL (ref 8.9–10.3)
CO2: 24 mmol/L (ref 22–32)
Chloride: 103 mmol/L (ref 98–111)
Creatinine, Ser: 0.81 mg/dL (ref 0.61–1.24)
Glucose, Bld: 149 mg/dL — ABNORMAL HIGH (ref 70–99)
POTASSIUM: 3.7 mmol/L (ref 3.5–5.1)
Sodium: 136 mmol/L (ref 135–145)
TOTAL PROTEIN: 7.4 g/dL (ref 6.5–8.1)

## 2018-09-29 LAB — LIPASE, BLOOD: LIPASE: 21 U/L (ref 11–51)

## 2018-09-29 MED ORDER — DICYCLOMINE HCL 10 MG/ML IM SOLN
20.0000 mg | Freq: Once | INTRAMUSCULAR | Status: AC
  Administered 2018-09-29: 20 mg via INTRAMUSCULAR
  Filled 2018-09-29: qty 2

## 2018-09-29 MED ORDER — MORPHINE SULFATE (PF) 4 MG/ML IV SOLN
4.0000 mg | Freq: Once | INTRAVENOUS | Status: AC
  Administered 2018-09-29: 4 mg via INTRAVENOUS
  Filled 2018-09-29: qty 1

## 2018-09-29 MED ORDER — SODIUM CHLORIDE 0.9 % IV SOLN
1000.0000 mL | Freq: Once | INTRAVENOUS | Status: AC
  Administered 2018-09-29: 1000 mL via INTRAVENOUS

## 2018-09-29 MED ORDER — IOHEXOL 300 MG/ML  SOLN
100.0000 mL | Freq: Once | INTRAMUSCULAR | Status: AC | PRN
  Administered 2018-09-29: 100 mL via INTRAVENOUS

## 2018-09-29 MED ORDER — MORPHINE SULFATE (PF) 4 MG/ML IV SOLN
INTRAVENOUS | Status: AC
  Administered 2018-09-29: 4 mg via INTRAVENOUS
  Filled 2018-09-29: qty 1

## 2018-09-29 MED ORDER — PROMETHAZINE HCL 25 MG/ML IJ SOLN
25.0000 mg | Freq: Once | INTRAMUSCULAR | Status: AC
  Administered 2018-09-29: 25 mg via INTRAVENOUS
  Filled 2018-09-29: qty 1

## 2018-09-29 MED ORDER — MORPHINE SULFATE (PF) 4 MG/ML IV SOLN
4.0000 mg | Freq: Once | INTRAVENOUS | Status: AC
  Administered 2018-09-29: 4 mg via INTRAVENOUS

## 2018-09-29 MED ORDER — HALOPERIDOL LACTATE 5 MG/ML IJ SOLN
5.0000 mg | Freq: Once | INTRAMUSCULAR | Status: AC
  Administered 2018-09-29: 5 mg via INTRAVENOUS
  Filled 2018-09-29: qty 1

## 2018-09-29 MED ORDER — ONDANSETRON HCL 4 MG/2ML IJ SOLN
4.0000 mg | Freq: Once | INTRAMUSCULAR | Status: AC
  Administered 2018-09-29: 4 mg via INTRAVENOUS
  Filled 2018-09-29: qty 2

## 2018-09-29 MED ORDER — DIPHENHYDRAMINE HCL 50 MG/ML IJ SOLN
25.0000 mg | Freq: Once | INTRAMUSCULAR | Status: AC
  Administered 2018-09-29: 25 mg via INTRAVENOUS
  Filled 2018-09-29: qty 1

## 2018-09-29 NOTE — ED Notes (Addendum)
Pt left AMA. Pt ambulatory from room but advised of danger in leaving post medication administration. Pt refused to sign for discharge.

## 2018-09-29 NOTE — ED Notes (Signed)
Pt refusing to wait for continued assessment and effectiveness of medications given. Pt states wants to leave and requested IV be removed. Pt told he was leaving AMA and no prescriptions could be given without additional MD follow up. This RN removed pt's IV. Pt ambulatory from room without assist. Pt informed of danger leaving after med administration but pt refused to stay.

## 2018-09-29 NOTE — ED Triage Notes (Signed)
Pt arrives to ER via POV c/o severe abdominal pain. Pt writhing around in bed, moaning, unable to sit still. States he was discharged  Yesterday with "[facility]".

## 2018-09-29 NOTE — ED Notes (Signed)
Patient transported to CT 

## 2018-09-29 NOTE — ED Notes (Signed)
Pt stated when I brought in the blankets, "i'm starting to feel better" pt continues to thrash in the bed. SO at bedside.

## 2018-09-29 NOTE — ED Notes (Signed)
MD Kinner at bedside  

## 2018-09-29 NOTE — ED Notes (Signed)
Pt continues to moan and thrash in the bed, c/o pain, no vomiting noted.  Informed patient and SO that Dr. Cyril Loosen would be in to go over test results.

## 2018-09-29 NOTE — ED Notes (Signed)
Pt thrashing around in the bed, moaning c/o pain, SO with pt states the pain is worse today than yesterday.  Pt given 4 morphine and 4 zofran IV, IVF started, informed pt CT would come to get pt when labs were back.  SO states the pain is in the LLQ and pt has been complaining of cramping.

## 2018-09-29 NOTE — ED Provider Notes (Signed)
[facility] Emergency Department Provider Note   ____________________________________________    I have reviewed the triage vital signs and the nursing notes.   HISTORY  Chief Complaint Abdominal Pain     HPI Blake Kerr is a 43 y.o. male who presents with complaints of abdominal cramping, nausea and vomiting.  Patient seen by me yesterday for similar complaints.  He reports he was doing well throughout most of the day yesterday and then his abdominal cramping started again overnight.  He reports it is severe diffuse abdominal cramping with nausea and vomiting, nonbilious nonbloody.  Took a nausea pill with no relief.  Long history of cyclical vomiting syndrome as noted in my note yesterday.   Past Medical History:  Diagnosis Date  . GERD (gastroesophageal reflux disease)     Patient Active Problem List   Diagnosis Date Noted  . Intractable nausea and vomiting 04/24/2016  . Sepsis (HCC) 04/23/2016  . Nausea vomiting and diarrhea 02/28/2015    Past Surgical History:  Procedure Laterality Date  . ESOPHAGOGASTRODUODENOSCOPY Bilateral 03/02/2015   Procedure: ESOPHAGOGASTRODUODENOSCOPY (EGD);  Surgeon: Wallace Cullens, MD;  Location: [facility] ENDOSCOPY;  Service: Gastroenterology;  Laterality: Bilateral;    Prior to Admission medications   Medication Sig Start Date End Date Taking? Authorizing Provider  capsaicin (ZOSTRIX) 0.025 % cream Apply topically 2 (two) times daily. Apply to upper stomach region 08/23/18   Willy Eddy, MD  cyclobenzaprine (FLEXERIL) 10 MG tablet Take 10 mg by mouth 2 (two) times daily as needed for muscle spasms. 03/29/16   [provider]  dicyclomine (BENTYL) 20 MG tablet Take 1 tablet (20 mg total) by mouth 3 (three) times daily as needed for spasms. Patient not taking: Reported on 10/21/2016 10/06/16   Tilden Fossa, MD  diphenhydrAMINE (BENADRYL) 25 mg capsule Take 2 capsules (50 mg total) by mouth every 6  (six) hours as needed. 10/21/16   Sharman Cheek, MD  famotidine (PEPCID) 20 MG tablet Take 1 tablet (20 mg total) by mouth 2 (two) times daily. 10/21/16   Sharman Cheek, MD  ketorolac (ACULAR) 0.5 % ophthalmic solution Place 1 drop into the right eye 4 (four) times daily. Patient not taking: Reported on 08/13/2016 08/09/16   Cuthriell, Delorise Royals, PA-C  ondansetron (ZOFRAN ODT) 4 MG disintegrating tablet Allow 1-2 tablets to dissolve in your mouth every 8 hours as needed for nausea/vomiting 11/02/16   Loleta Rose, MD  promethazine (PHENERGAN) 12.5 MG tablet Take 1 tablet (12.5 mg total) by mouth every 6 (six) hours as needed for nausea or vomiting. 09/28/18   Jene Every, MD     Allergies Patient has no known allergies.  Family History  Problem Relation Age of Onset  . Hypertension Mother   . Hypertension Father   . Hyperlipidemia Father     Social History Social History   Tobacco Use  . Smoking status: Current Every Day Smoker    Packs/day: 0.50    Types: Cigarettes  . Smokeless tobacco: Never Used  Substance Use Topics  . Alcohol use: Yes    Alcohol/week: 3.0 standard drinks    Types: 3 Cans of beer per week  . Drug use: Yes    Types: Marijuana    Review of Systems  Constitutional: No fever/chills Eyes: No visual changes.  ENT: No sore throat. Cardiovascular: Denies chest pain. Respiratory: Denies shortness of breath. Gastrointestinal: As above Genitourinary: Negative for dysuria. Musculoskeletal: Negative for back pain. Skin: Negative for rash. Neurological: Negative for  headaches or weakness   ____________________________________________   PHYSICAL EXAM:  VITAL SIGNS: ED Triage Vitals  Enc Vitals Group     BP 09/29/18 0855 (!) 155/108     Pulse Rate 09/29/18 0855 (!) 106     Resp 09/29/18 0855 (!) 26     Temp 09/29/18 0856 98.4 F (36.9 C)     Temp Source 09/29/18 0856 Oral     SpO2 09/29/18 0855 98 %     Weight 09/29/18 0854 77 kg (169 lb 12.1  oz)     Height 09/29/18 0854 1.854 m (6\' 1" )     Head Circumference --      Peak Flow --      Pain Score 09/29/18 0854 10     Pain Loc --      Pain Edu? --      Excl. in GC? --     Constitutional: Alert and oriented.  Anxious Eyes: Conjunctivae are normal.   Nose: No congestion/rhinnorhea. Mouth/Throat: Mucous membranes are moist.    Cardiovascular: Normal rate, regular rhythm. Grossly normal heart sounds.  Good peripheral circulation. Respiratory: Normal respiratory effort.  No retractions. Lungs CTAB. Gastrointestinal: Soft and nontender. No distention.    Musculoskeletal:   Warm and well perfused Neurologic:  Normal speech and language. No gross focal neurologic deficits are appreciated.  Skin:  Skin is warm, dry and intact. No rash noted. Psychiatric: Mood and affect are normal. Speech and behavior are normal.  ____________________________________________   LABS (all labs ordered are listed, but only abnormal results are displayed)  Labs Reviewed  CBC - Abnormal; Notable for the following components:      Result Value   WBC 12.5 (*)    All other components within normal limits  COMPREHENSIVE METABOLIC PANEL - Abnormal; Notable for the following components:   Glucose, Bld 149 (*)    All other components within normal limits  LIPASE, BLOOD   ____________________________________________  EKG  None ____________________________________________  RADIOLOGY  CT abdomen pelvis unremarkable ____________________________________________   PROCEDURES  Procedure(s) performed: No  Procedures   Critical Care performed: No ____________________________________________   INITIAL IMPRESSION / ASSESSMENT AND PLAN / ED COURSE  Pertinent labs & imaging results that were available during my care of the patient were reviewed by me and considered in my medical decision making (see chart for details).  Patient presents with abdominal cramping, nausea and vomiting.  We  will treat with IV fluids, IV Zofran, IV morphine and obtain CT imaging.  Lab work is unremarkable  CT is unremarkable  Patient felt better after 2 doses of IV medication but symptoms returned shortly thereafter.  Gave 5 mg of Haldol and 50 cc of normal saline while in the cardiac monitor as this is helped symptoms many times before.    Haldol did not appear to have any benefit  Patient did start complaining of akathisia-like symptoms although difficult to determine if this was any different than his prior complaints, IV Benadryl ordered  Patient stated he is not willing to stay in the hospital anymore and has decided to leave AGAINST MEDICAL ADVICE.  He has decisional capacity and knows that he can return anytime    ____________________________________________   FINAL CLINICAL IMPRESSION(S) / ED DIAGNOSES  Final diagnoses:  Cyclical vomiting syndrome        Note:  This document was prepared using Dragon voice recognition software and may include unintentional dictation errors.   Jene EveryKinner, Saafir Abdullah, MD 09/29/18 1247

## 2018-10-10 ENCOUNTER — Other Ambulatory Visit: Payer: Self-pay

## 2018-10-10 ENCOUNTER — Ambulatory Visit (INDEPENDENT_AMBULATORY_CARE_PROVIDER_SITE_OTHER): Payer: Self-pay | Admitting: Gastroenterology

## 2018-10-10 ENCOUNTER — Other Ambulatory Visit: Payer: Self-pay | Admitting: Gastroenterology

## 2018-10-10 ENCOUNTER — Encounter: Payer: Self-pay | Admitting: Gastroenterology

## 2018-10-10 VITALS — BP 118/74 | HR 88 | Ht 73.0 in | Wt 148.6 lb

## 2018-10-10 DIAGNOSIS — F1210 Cannabis abuse, uncomplicated: Secondary | ICD-10-CM

## 2018-10-10 DIAGNOSIS — R112 Nausea with vomiting, unspecified: Secondary | ICD-10-CM

## 2018-10-10 DIAGNOSIS — R109 Unspecified abdominal pain: Secondary | ICD-10-CM

## 2018-10-10 MED ORDER — OMEPRAZOLE 40 MG PO CPDR: 40.0000 mg | DELAYED_RELEASE_CAPSULE | Freq: Every day | ORAL | 3 refills | Status: DC

## 2018-10-10 NOTE — Progress Notes (Signed)
Wyline MoodKiran Hae Ahlers MD, MRCP(U.K) [address]  [address]  [place], [place] [address]  Main: [phone]  Fax: [phone]   Gastroenterology Consultation  Referring Provider:    ER Primary Care Physician:  Patient, No Pcp Per Primary Gastroenterologist:  Dr. Wyline MoodKiran Dante Roudebush  Reason for Consultation:     Abdominal pain , vomiting         HPI:   Blake Kerr is a 43 y.o. y/o male referred for consultation & management  by Dr. Patient, No Pcp Per.     He has been referred for vomiting.  He has been seen at the emergency room on multiple occasions last on 09/29/2018 for abdominal pain cramping nausea and vomiting which began the day earlier.  He was treated symptomatically and discharged home.  He was also seen similarly the day prior and the emergency room there was a mention of polysubstance abuse in the past.  The vomiting was nonbloody and nonbilious.  CT abdomen and pelvis on 09/29/2018 was normal.  Labs on 09/29/2018 showed a hemoglobin of 15.2 g, glucose of 149 creatinine of 0.81 and liver function test which were completely normal.  Lipase of 21.  No recent urine drug analysis.  I do not see a primary care physician in his epic.  I do not see any prior GI office visits.   Says he has had vomiting for over 2 years, used to have it less often and presently has it every week , each episode lasts 3-4 months, throws up all day. No blood, he does have heartburn , "extreme stomach pain, lower abdomen, all day long , affects him even when he is not vomiting The pain is worse by dry heaving and pain is better after vomiting, coffee, caffeine, percocet's " acquired on his own".   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 Feels better with a hot shower.   Lost weight "40 lbs", recently has "stopped using the bathroom", no rectal bleeding.   No family history of any cancers. Denies any NSAID's.       Past Medical History:  Diagnosis Date  . GERD (gastroesophageal reflux disease)     Past Surgical History:  Procedure Laterality Date  . ESOPHAGOGASTRODUODENOSCOPY Bilateral 03/02/2015   Procedure: ESOPHAGOGASTRODUODENOSCOPY (EGD);  Surgeon: Wallace CullensPaul Y Oh, MD;  [facility] ENDOSCOPY;  Service: Gastroenterology;  Laterality: Bilateral;    Prior to Admission medications   Medication Sig Start Date End Date Taking? Authorizing Provider  capsaicin (ZOSTRIX) 0.025 % cream Apply topically 2 (two) times daily. Apply to upper stomach region 08/23/18   Willy Eddyobinson, Patrick, MD  cyclobenzaprine (FLEXERIL) 10 MG tablet Take 10 mg by mouth 2 (two) times daily as needed for muscle spasms. 03/29/16   [provider]  dicyclomine (BENTYL) 20 MG tablet Take 1 tablet (20 mg total) by mouth 3 (three) times daily as needed for spasms. Patient not taking: Reported on 10/21/2016 10/06/16   Tilden Fossaees, Elizabeth, MD  diphenhydrAMINE (BENADRYL) 25 mg capsule Take 2 capsules (50 mg total) by mouth every 6 (six) hours as needed. 10/21/16   Sharman CheekStafford, Phillip, MD  famotidine (PEPCID) 20 MG tablet Take 1 tablet (20 mg total) by mouth 2 (two) times daily. 10/21/16   Sharman CheekStafford, Phillip, MD  ketorolac (ACULAR) 0.5 % ophthalmic solution Place 1 drop into the right eye 4 (four) times daily. Patient not taking: Reported on 08/13/2016 08/09/16  Cuthriell, Delorise RoyalsJonathan D, PA-C  ondansetron (ZOFRAN ODT) 4 MG disintegrating tablet Allow 1-2 tablets to dissolve in your mouth every 8 hours as needed for nausea/vomiting 11/02/16   Loleta RoseForbach, Cory, MD  promethazine (PHENERGAN) 12.5 MG tablet Take 1 tablet (12.5 mg total) by mouth every 6 (six) hours as needed for nausea or vomiting. 09/28/18   Jene EveryKinner, Robert, MD    Family History  Problem Relation Age of Onset  . Hypertension Mother   . Hypertension Father   . Hyperlipidemia Father      Social History   Tobacco Use  . Smoking status: Current Every Day Smoker    Packs/day: 0.50     Types: Cigarettes  . Smokeless tobacco: Never Used  Substance Use Topics  . Alcohol use: Yes    Alcohol/week: 3.0 standard drinks    Types: 3 Cans of beer per week  . Drug use: Yes    Types: Marijuana    Allergies as of 10/10/2018  . (No Known Allergies)    Review of Systems:    All systems reviewed and negative except where noted in HPI.   Physical Exam:  BP 118/74   Pulse 88   Ht 6\' 1"  (1.854 m)   Wt 148 lb 9.6 oz (67.4 kg)   BMI 19.61 kg/m  No LMP for male patient. Psych:  Alert and cooperative. Normal mood and affect. General:   Alert,  Well-developed, well-nourished, pleasant and cooperative in NAD Head:  Normocephalic and atraumatic. Eyes:  Sclera clear, no icterus.   Conjunctiva pink. Ears:  Normal auditory acuity. Nose:  No deformity, discharge, or lesions. Mouth:  No deformity or lesions,oropharynx pink & moist. Neck:  Supple; no masses or thyromegaly. Lungs:  Respirations even and unlabored.  Clear throughout to auscultation.   No wheezes, crackles, or rhonchi. No acute distress. Heart:  Regular rate and rhythm; no murmurs, clicks, rubs, or gallops. Abdomen:  Normal bowel sounds.  No bruits.  Soft, non-tender and non-distended without masses, hepatosplenomegaly or hernias noted.  No guarding or rebound tenderness.    Msk:  Symmetrical without gross deformities. Good, equal movement & strength bilaterally. Pulses:  Normal pulses noted. Extremities:  No clubbing or edema.  No cyanosis. Neurologic:  Alert and oriented x3;  grossly normal neurologically. Skin:  Intact without significant lesions or rashes. No jaundice. Lymph Nodes:  No significant cervical adenopathy. Psych:  Alert and cooperative. Normal mood and affect.  Imaging Studies: Ct Abdomen Pelvis W Contrast  Result Date: 09/29/2018 CLINICAL DATA:  Umbilical pain EXAM: CT ABDOMEN AND PELVIS WITH CONTRAST TECHNIQUE: Multidetector CT imaging of the abdomen and pelvis was performed using the standard  protocol following bolus administration of intravenous contrast. CONTRAST:  100mL OMNIPAQUE IOHEXOL 300 MG/ML  SOLN COMPARISON:  08/11/2016 FINDINGS: Lower chest: Lung bases are clear. No effusions. Heart is normal size. Hepatobiliary: No focal hepatic abnormality. Gallbladder unremarkable. Pancreas: No focal abnormality or ductal dilatation. Spleen: No focal abnormality.  Normal size. Adrenals/Urinary Tract: No adrenal abnormality. No focal renal abnormality. No stones or hydronephrosis. Urinary bladder is unremarkable. Stomach/Bowel: Normal appendix. Stomach, large and small bowel grossly unremarkable. Vascular/Lymphatic: No evidence of aneurysm or adenopathy. Reproductive: No visible focal abnormality. Other: No free fluid or free air. Musculoskeletal: No acute bony abnormality. IMPRESSION: No acute findings in the abdomen or pelvis. Electronically Signed   By: Charlett NoseKevin  Dover M.D.   On: 09/29/2018 09:30    Assessment and Plan:   Blake Kerr is a 43 y.o. y/o male has  been referred for recurremt vomiting, abdominal apin with ongoing marijuana use and relief with hot showers highly suggestive of cannaboid hyperemesis syndrome. In view of long standing abdominal pain will evaluate.    Plan 1.  H. pylori breath test  2.  Urine drug screen. 3.  EGD+colonoscopy  4.  PPI 5.  He must establish care with a primary care physician to continue care with our practice. 6. Stop smoking and cannabis use   I have discussed alternative options, risks & benefits,  which include, but are not limited to, bleeding, infection, perforation,respiratory complication & drug reaction.  The patient agrees with this plan & written consent will be obtained.   Follow up in 3 weeks   Dr [name] MD,MRCP(U.K)

## 2018-10-10 NOTE — Patient Instructions (Signed)
Cannabinoid Hyperemesis Syndrome Cannabinoid hyperemesis syndrome (CHS) is a condition that causes repeated nausea, vomiting, and abdominal pain after long-term (chronic) use of marijuana (cannabis). People with CHS typically use marijuana 3-5 times a day for many years before they have symptoms, although it is possible to develop CHS with as little as 1 use per day. Symptoms of CHS may be mild at first but can get worse and more frequent. In some cases, CHS may cause vomiting many times a day, which can lead to weight loss and dehydration. CHS may go away and come back many times (recur). People may not have symptoms or may otherwise be healthy in between CHS attacks. What are the causes? The exact cause of this condition is not known. Long-term use of marijuana may over-stimulate certain proteins in the brain that react with chemicals in marijuana (cannabinoid receptors). This over-stimulation may cause CHS. What are the signs or symptoms? Symptoms of this condition are often mild during the first few attacks, but they can get worse over time. Symptoms may include:  Frequent nausea, especially early in the morning.  Vomiting.  Abdominal pain. Taking several hot showers throughout the day can also be a sign of this condition. People with CHS may do this because it relieves symptoms. How is this diagnosed? This condition may be diagnosed based on:  Your symptoms and medical history, including any drug use.  A physical exam. You may have tests done to rule out other problems. These tests may include:  Blood tests.  Urine tests.  Imaging tests, such as an X-ray or CT scan. How is this treated? Treatment for this condition involves stopping marijuana use. Your health care provider may recommend:  A drug rehabilitation program, if you have trouble stopping marijuana use.  Medicines for nausea.  Hot showers to help relieve symptoms. Certain creams that contain a substance called  capsaicin may improve symptoms when applied to the abdomen. Ask your health care provider before starting any medicines or other treatments. Severe nausea and vomiting may require you to stay at the hospital. You may need IV fluids to prevent or treat dehydration. You may also need certain medicines that must be given at the hospital. Follow these instructions at home: During an attack   Stay in bed and rest in a dark, quiet room.  Take anti-nausea medicine as told by your health care provider.  Try taking hot showers to relieve your symptoms. After an attack  Drink small amounts of clear fluids slowly. Gradually add more.  Once you are able to eat without vomiting, eat soft foods in small amounts every 3-4 hours. General instructions   Do not use any products that contain marijuana.If you need help quitting, ask your health care provider for resources and treatment options.  Drink enough fluid to keep your urine pale yellow. Avoid drinking fluids that have a lot of sugar or caffeine, such as coffee and soda.  Take and apply over-the-counter and prescription medicines only as told by your health care provider. Ask your health care provider before starting any new medicines or treatments.  Keep all follow-up visits as told by your health care provider. This is important. Contact a health care provider if:  Your symptoms get worse.  You cannot drink fluids without vomiting.  You have pain and trouble swallowing after an attack. Get help right away if:  You cannot stop vomiting.  You have blood in your vomit or your vomit looks like coffee grounds.  You have   severe abdominal pain.  You have stools that are bloody or black, or stools that look like tar.  You have symptoms of dehydration, such as: ? Sunken eyes. ? Inability to make tears. ? Cracked lips. ? Dry mouth. ? Decreased urine production. ? Weakness. ? Sleepiness. ? Fainting. Summary  Cannabinoid hyperemesis  syndrome (CHS) is a condition that causes repeated nausea, vomiting, and abdominal pain after long-term use of marijuana.  People with CHS typically use marijuana 3-5 times a day for many years before they have symptoms, although it is possible to develop CHS with as little as 1 use per day.  Treatment for this condition involves stopping marijuana use. Hot showers and capsaicin creams may also help relieve symptoms. Ask your health care provider before starting any medicines or other treatments.  Your health care provider may prescribe medicines to help with nausea.  Get help right away if you have signs of dehydration, such as dry mouth, decreased urine production, or weakness. This information is not intended to replace advice given to you by your health care provider. Make sure you discuss any questions you have with your health care provider. Document Released: 12/08/2016 Document Revised: 12/08/2016 Document Reviewed: 12/08/2016 Elsevier Interactive Patient Education  2019 [employer]  Cannabinoid Hyperemesis Syndrome Cannabinoid hyperemesis syndrome (CHS) is a condition that causes repeated nausea, vomiting, and abdominal pain after long-term (chronic) use of marijuana (cannabis). People with CHS typically use marijuana 3-5 times a day for many years before they have symptoms, although it is possible to develop CHS with as little as 1 use per day. Symptoms of CHS may be mild at first but can get worse and more frequent. In some cases, CHS may cause vomiting many times a day, which can lead to weight loss and dehydration. CHS may go away and come back many times (recur). People may not have symptoms or may otherwise be healthy in [facility]. What are the causes? The exact cause of this condition is not known. Long-term use of marijuana may over-stimulate certain proteins in the brain that react with chemicals in marijuana (cannabinoid receptors). This over-stimulation may cause  CHS. What are the signs or symptoms? Symptoms of this condition are often mild during the first few attacks, but they can get worse over time. Symptoms may include:  Frequent nausea, especially early in the morning.  Vomiting.  Abdominal pain. Taking several hot showers throughout the day can also be a sign of this condition. People with CHS may do this because it relieves symptoms. How is this diagnosed? This condition may be diagnosed based on:  Your symptoms and medical history, including any drug use.  A physical exam. You may have tests done to rule out other problems. These tests may include:  Blood tests.  Urine tests.  Imaging tests, such as an X-ray or CT scan. How is this treated? Treatment for this condition involves stopping marijuana use. Your health care provider may recommend:  A drug rehabilitation program, if you have trouble stopping marijuana use.  Medicines for nausea.  Hot showers to help relieve symptoms. Certain creams that contain a substance called capsaicin may improve symptoms when applied to the abdomen. Ask your health care provider before starting any medicines or other treatments. Severe nausea and vomiting may require you to stay at the hospital. You may need IV fluids to prevent or treat dehydration. You may also need certain medicines that must be given at the hospital. Follow these instructions at home: During  an attack   Stay in bed and rest in a dark, quiet room.  Take anti-nausea medicine as told by your health care provider.  Try taking hot showers to relieve your symptoms. After an attack  Drink small amounts of clear fluids slowly. Gradually add more.  Once you are able to eat without vomiting, eat soft foods in small amounts every 3-4 hours. General instructions   Do not use any products that contain marijuana.If you need help quitting, ask your health care provider for resources and treatment options.  Drink enough fluid  to keep your urine pale yellow. Avoid drinking fluids that have a lot of sugar or caffeine, such as coffee and soda.  Take and apply over-the-counter and prescription medicines only as told by your health care provider. Ask your health care provider before starting any new medicines or treatments.  Keep all follow-up visits as told by your health care provider. This is important. Contact a health care provider if:  Your symptoms get worse.  You cannot drink fluids without vomiting.  You have pain and trouble swallowing after an attack. Get help right away if:  You cannot stop vomiting.  You have blood in your vomit or your vomit looks like coffee grounds.  You have severe abdominal pain.  You have stools that are bloody or black, or stools that look like tar.  You have symptoms of dehydration, such as: ? Sunken eyes. ? Inability to make tears. ? Cracked lips. ? Dry mouth. ? Decreased urine production. ? Weakness. ? Sleepiness. ? Fainting. Summary  Cannabinoid hyperemesis syndrome (CHS) is a condition that causes repeated nausea, vomiting, and abdominal pain after long-term use of marijuana.  People with CHS typically use marijuana 3-5 times a day for many years before they have symptoms, although it is possible to develop CHS with as little as 1 use per day.  Treatment for this condition involves stopping marijuana use. Hot showers and capsaicin creams may also help relieve symptoms. Ask your health care provider before starting any medicines or other treatments.  Your health care provider may prescribe medicines to help with nausea.  Get help right away if you have signs of dehydration, such as dry mouth, decreased urine production, or weakness. This information is not intended to replace advice given to you by your health care provider. Make sure you discuss any questions you have with your health care provider. Document Released: 12/08/2016 Document Revised: 12/08/2016  Document Reviewed: 12/08/2016 Elsevier Interactive Patient Education  2019 [employer]  Cannabinoid Hyperemesis Syndrome Cannabinoid hyperemesis syndrome (CHS) is a condition that causes repeated nausea, vomiting, and abdominal pain after long-term (chronic) use of marijuana (cannabis). People with CHS typically use marijuana 3-5 times a day for many years before they have symptoms, although it is possible to develop CHS with as little as 1 use per day. Symptoms of CHS may be mild at first but can get worse and more frequent. In some cases, CHS may cause vomiting many times a day, which can lead to weight loss and dehydration. CHS may go away and come back many times (recur). People may not have symptoms or may otherwise be healthy in between [facility] attacks. What are the causes? The exact cause of this condition is not known. Long-term use of marijuana may over-stimulate certain proteins in the brain that react with chemicals in marijuana (cannabinoid receptors). This over-stimulation may cause CHS. What are the signs or symptoms? Symptoms of this condition are often mild during the  first few attacks, but they can get worse over time. Symptoms may include:  Frequent nausea, especially early in the morning.  Vomiting.  Abdominal pain. Taking several hot showers throughout the day can also be a sign of this condition. People with CHS may do this because it relieves symptoms. How is this diagnosed? This condition may be diagnosed based on:  Your symptoms and medical history, including any drug use.  A physical exam. You may have tests done to rule out other problems. These tests may include:  Blood tests.  Urine tests.  Imaging tests, such as an X-ray or CT scan. How is this treated? Treatment for this condition involves stopping marijuana use. Your health care provider may recommend:  A drug rehabilitation program, if you have trouble stopping marijuana use.  Medicines for  nausea.  Hot showers to help relieve symptoms. Certain creams that contain a substance called capsaicin may improve symptoms when applied to the abdomen. Ask your health care provider before starting any medicines or other treatments. Severe nausea and vomiting may require you to stay at the hospital. You may need IV fluids to prevent or treat dehydration. You may also need certain medicines that must be given at the hospital. Follow these instructions at home: During an attack   Stay in bed and rest in a dark, quiet room.  Take anti-nausea medicine as told by your health care provider.  Try taking hot showers to relieve your symptoms. After an attack  Drink small amounts of clear fluids slowly. Gradually add more.  Once you are able to eat without vomiting, eat soft foods in small amounts every 3-4 hours. General instructions   Do not use any products that contain marijuana.If you need help quitting, ask your health care provider for resources and treatment options.  Drink enough fluid to keep your urine pale yellow. Avoid drinking fluids that have a lot of sugar or caffeine, such as coffee and soda.  Take and apply over-the-counter and prescription medicines only as told by your health care provider. Ask your health care provider before starting any new medicines or treatments.  Keep all follow-up visits as told by your health care provider. This is important. Contact a health care provider if:  Your symptoms get worse.  You cannot drink fluids without vomiting.  You have pain and trouble swallowing after an attack. Get help right away if:  You cannot stop vomiting.  You have blood in your vomit or your vomit looks like coffee grounds.  You have severe abdominal pain.  You have stools that are bloody or black, or stools that look like tar.  You have symptoms of dehydration, such as: ? Sunken eyes. ? Inability to make tears. ? Cracked lips. ? Dry mouth. ? Decreased  urine production. ? Weakness. ? Sleepiness. ? Fainting. Summary  Cannabinoid hyperemesis syndrome (CHS) is a condition that causes repeated nausea, vomiting, and abdominal pain after long-term use of marijuana.  People with CHS typically use marijuana 3-5 times a day for many years before they have symptoms, although it is possible to develop CHS with as little as 1 use per day.  Treatment for this condition involves stopping marijuana use. Hot showers and capsaicin creams may also help relieve symptoms. Ask your health care provider before starting any medicines or other treatments.  Your health care provider may prescribe medicines to help with nausea.  Get help right away if you have signs of dehydration, such as dry mouth, decreased urine production,  or weakness. This information is not intended to replace advice given to you by your health care provider. Make sure you discuss any questions you have with your health care provider. Document Released: 12/08/2016 Document Revised: 12/08/2016 Document Reviewed: 12/08/2016 Elsevier Interactive Patient Education  2019 [employer].

## 2018-10-11 ENCOUNTER — Encounter: Payer: Self-pay | Admitting: Gastroenterology

## 2018-10-11 LAB — DRUG SCREEN, URINE
Amphetamines, Urine: NEGATIVE ng/mL
Barbiturate screen, urine: NEGATIVE ng/mL
Benzodiazepine Quant, Ur: NEGATIVE ng/mL
Cannabinoid Quant, Ur: POSITIVE ng/mL — AB
Cocaine (Metab.): NEGATIVE ng/mL
Opiate Quant, Ur: NEGATIVE ng/mL
PCP Quant, Ur: NEGATIVE ng/mL

## 2018-10-11 LAB — TSH: TSH: 0.355 u[IU]/mL — ABNORMAL LOW (ref 0.450–4.500)

## 2018-10-12 ENCOUNTER — Encounter: Payer: Self-pay | Admitting: Gastroenterology

## 2018-10-12 LAB — H. PYLORI BREATH TEST: H pylori Breath Test: NEGATIVE

## 2018-10-16 ENCOUNTER — Ambulatory Visit
Admission: RE | Admit: 2018-10-16 | Discharge: 2018-10-16 | Disposition: A | Discharge status: Home / Self Care | Payer: Self-pay | Source: Ambulatory Visit | Attending: Gastroenterology | Admitting: Gastroenterology

## 2018-10-16 ENCOUNTER — Encounter
Admission: RE | Disposition: A | Discharge status: Home / Self Care | Payer: Self-pay | Source: Ambulatory Visit | Attending: Gastroenterology

## 2018-10-16 ENCOUNTER — Ambulatory Visit: Payer: Self-pay | Admitting: Certified Registered Nurse Anesthetist

## 2018-10-16 ENCOUNTER — Encounter: Payer: Self-pay | Admitting: Certified Registered Nurse Anesthetist

## 2018-10-16 DIAGNOSIS — Z79899 Other long term (current) drug therapy: Secondary | ICD-10-CM | POA: Insufficient documentation

## 2018-10-16 DIAGNOSIS — R109 Unspecified abdominal pain: Secondary | ICD-10-CM | POA: Insufficient documentation

## 2018-10-16 DIAGNOSIS — K219 Gastro-esophageal reflux disease without esophagitis: Secondary | ICD-10-CM | POA: Insufficient documentation

## 2018-10-16 DIAGNOSIS — F17210 Nicotine dependence, cigarettes, uncomplicated: Secondary | ICD-10-CM | POA: Insufficient documentation

## 2018-10-16 DIAGNOSIS — R112 Nausea with vomiting, unspecified: Secondary | ICD-10-CM

## 2018-10-16 HISTORY — PX: ESOPHAGOGASTRODUODENOSCOPY (EGD) WITH PROPOFOL: SHX5813

## 2018-10-16 HISTORY — PX: COLONOSCOPY WITH PROPOFOL: SHX5780

## 2018-10-16 SURGERY — COLONOSCOPY WITH PROPOFOL
Anesthesia: General

## 2018-10-16 MED ORDER — PROPOFOL 500 MG/50ML IV EMUL
INTRAVENOUS | Status: DC | PRN
  Administered 2018-10-16: 130 ug/kg/min via INTRAVENOUS

## 2018-10-16 MED ORDER — MIDAZOLAM HCL 2 MG/2ML IJ SOLN
INTRAMUSCULAR | Status: AC
  Filled 2018-10-16: qty 2

## 2018-10-16 MED ORDER — MIDAZOLAM HCL 2 MG/2ML IJ SOLN
INTRAMUSCULAR | Status: DC | PRN
  Administered 2018-10-16: 2 mg via INTRAVENOUS

## 2018-10-16 MED ORDER — LIDOCAINE HCL (PF) 2 % IJ SOLN
INTRAMUSCULAR | Status: AC
  Filled 2018-10-16: qty 10

## 2018-10-16 MED ORDER — FENTANYL CITRATE (PF) 100 MCG/2ML IJ SOLN
INTRAMUSCULAR | Status: AC
  Filled 2018-10-16: qty 2

## 2018-10-16 MED ORDER — PROPOFOL 500 MG/50ML IV EMUL
INTRAVENOUS | Status: AC
  Filled 2018-10-16: qty 50

## 2018-10-16 MED ORDER — FENTANYL CITRATE (PF) 100 MCG/2ML IJ SOLN
INTRAMUSCULAR | Status: DC | PRN
  Administered 2018-10-16: 50 ug via INTRAVENOUS

## 2018-10-16 MED ORDER — PROPOFOL 10 MG/ML IV BOLUS
INTRAVENOUS | Status: DC | PRN
  Administered 2018-10-16: 90 mg via INTRAVENOUS

## 2018-10-16 MED ORDER — SODIUM CHLORIDE 0.9 % IV SOLN
INTRAVENOUS | Status: DC
  Administered 2018-10-16: 1000 mL via INTRAVENOUS

## 2018-10-16 NOTE — Op Note (Signed)
[facility] Gastroenterology Patient Name: Blake Kerr Procedure Date: 10/16/2018 10:16 AM MRN: [id number] Account #: [id number] Date of Birth: 04/01/1976 Admit Type: Outpatient Age: 43 [facility] Gender: Male Note Status: Finalized Procedure:            Upper GI endoscopy Indications:          Abdominal pain Providers:            Wyline MoodKiran Dinh Ayotte MD, MD Medicines:            Monitored Anesthesia Care Complications:        No immediate complications. Procedure:            Pre-Anesthesia Assessment:                       - Prior to the procedure, a History and Physical was                        performed, and patient medications, allergies and                        sensitivities were reviewed. The patient's tolerance of                        previous anesthesia was reviewed.                       - The risks and benefits of the procedure and the                        sedation options and risks were discussed with the                        patient. All questions were answered and informed                        consent was obtained.                       - ASA Grade Assessment: II - A patient with mild                        systemic disease.                       After obtaining informed consent, the endoscope was                        passed under direct vision. Throughout the procedure,                        the patient's blood pressure, pulse, and oxygen                        saturations were monitored continuously. The Endoscope                        was introduced through the mouth, and advanced to the                        third part of duodenum. The upper GI endoscopy was  accomplished with ease. The patient tolerated the                        procedure well. Findings:      The esophagus was normal.      The stomach was normal.      The examined duodenum was normal. Impression:           - Normal esophagus.                    - Normal stomach.                       - Normal examined duodenum.                       - No specimens collected. Recommendation:       - Perform a colonoscopy today. Procedure Code(s):    --- Professional ---                       [phone], Esophagogastroduodenoscopy, flexible, transoral;                        diagnostic, including collection of specimen(s) by                        brushing or washing, when performed (separate procedure) Diagnosis Code(s):    --- Professional ---                       R10.9, Unspecified abdominal pain CPT copyright 2018 American Medical Association. All rights reserved. The codes documented in this report are preliminary and upon coder review may  be revised to meet current compliance requirements. [name], MD [name] MD, MD 10/16/2018 10:28:44 AM This report has been signed electronically. Number of Addenda: 0 Note Initiated On: 10/16/2018 10:16 AM      [facility]

## 2018-10-16 NOTE — Anesthesia Preprocedure Evaluation (Signed)
Anesthesia Evaluation  Patient identified by MRN, date of birth, ID band Patient awake    Reviewed: Allergy & Precautions, H&P , NPO status , Patient's Chart, lab work & pertinent test results, reviewed documented beta blocker date and time   Airway Mallampati: II   Neck ROM: full    Dental  (+) Poor Dentition   Pulmonary neg pulmonary ROS, Current Smoker,    Pulmonary exam normal        Cardiovascular Exercise Tolerance: Good negative cardio ROS Normal cardiovascular exam Rhythm:regular Rate:Normal     Neuro/Psych negative neurological ROS  negative psych ROS   GI/Hepatic Neg liver ROS, GERD  Medicated,  Endo/Other  negative endocrine ROS  Renal/GU negative Renal ROS  negative genitourinary   Musculoskeletal   Abdominal   Peds  Hematology negative hematology ROS (+)   Anesthesia Other Findings Past Medical History: No date: GERD (gastroesophageal reflux disease) Past Surgical History: 03/02/2015: ESOPHAGOGASTRODUODENOSCOPY; Bilateral     Comment:  Procedure: ESOPHAGOGASTRODUODENOSCOPY (EGD);  Surgeon:               Wallace Cullens, MD;  Location: [facility] ENDOSCOPY;  Service:               Gastroenterology;  Laterality: Bilateral; BMI    Body Mass Index:  18.47 kg/m     Reproductive/Obstetrics negative OB ROS                             Anesthesia Physical Anesthesia Plan  ASA: II  Anesthesia Plan: General   Post-op Pain Management:    Induction:   PONV Risk Score and Plan:   Airway Management Planned:   Additional Equipment:   Intra-op Plan:   Post-operative Plan:   Informed Consent: I have reviewed the patients History and Physical, chart, labs and discussed the procedure including the risks, benefits and alternatives for the proposed anesthesia with the patient or authorized representative who has indicated his/her understanding and acceptance.     Dental Advisory  Given  Plan Discussed with: CRNA  Anesthesia Plan Comments:         Anesthesia Quick Evaluation

## 2018-10-16 NOTE — Anesthesia Postprocedure Evaluation (Signed)
Anesthesia Post Note  Patient: Blake Kerr  Procedure(s) Performed: COLONOSCOPY WITH PROPOFOL (N/A ) ESOPHAGOGASTRODUODENOSCOPY (EGD) WITH PROPOFOL (N/A )  Patient location during evaluation: PACU Anesthesia Type: General Level of consciousness: awake and alert Pain management: pain level controlled Vital Signs Assessment: post-procedure vital signs reviewed and stable Respiratory status: spontaneous breathing, nonlabored ventilation, respiratory function stable and patient connected to nasal cannula oxygen Cardiovascular status: blood pressure returned to baseline and stable Postop Assessment: no apparent nausea or vomiting Anesthetic complications: no     Last Vitals:  Vitals:   10/16/18 1044 10/16/18 1105  BP: 105/66 (!) 123/97  Pulse:    Resp: 16   Temp: (!) 36.1 C   SpO2: 97% 100%    Last Pain:  Vitals:   10/16/18 1105  TempSrc:   PainSc: 0-No pain                 Yevette Edwards

## 2018-10-16 NOTE — Anesthesia Post-op Follow-up Note (Signed)
Anesthesia QCDR form completed.        

## 2018-10-16 NOTE — H&P (Signed)
[name], MD [address], [place], [place], [address] [address], [address], [place], [place], [address] Phone: [phone]  Fax: [phone]  Primary Care Physician:  Patient, No Pcp Per   Pre-Procedure History & Physical: HPI:  Blake Kerr is a 43 y.o. male is here for an endoscopy and colonoscopy    Past Medical History:  Diagnosis Date  . GERD (gastroesophageal reflux disease)     Past Surgical History:  Procedure Laterality Date  . ESOPHAGOGASTRODUODENOSCOPY Bilateral 03/02/2015   Procedure: ESOPHAGOGASTRODUODENOSCOPY (EGD);  Surgeon: Wallace Cullens, MD;  Location: [facility] ENDOSCOPY;  Service: Gastroenterology;  Laterality: Bilateral;    Prior to Admission medications   Medication Sig Start Date End Date Taking? Authorizing Provider  capsaicin (ZOSTRIX) 0.025 % cream Apply topically 2 (two) times daily. Apply to upper stomach region 08/23/18  Yes Willy Eddy, MD  cyclobenzaprine (FLEXERIL) 10 MG tablet Take 10 mg by mouth 2 (two) times daily as needed for muscle spasms. 03/29/16  Yes [provider]  dicyclomine (BENTYL) 20 MG tablet Take 1 tablet (20 mg total) by mouth 3 (three) times daily as needed for spasms. 10/06/16  Yes Tilden Fossa, MD  diphenhydrAMINE (BENADRYL) 25 mg capsule Take 2 capsules (50 mg total) by mouth every 6 (six) hours as needed. 10/21/16  Yes Sharman Cheek, MD  famotidine (PEPCID) 20 MG tablet Take 1 tablet (20 mg total) by mouth 2 (two) times daily. 10/21/16  Yes Sharman Cheek, MD  ketorolac (ACULAR) 0.5 % ophthalmic solution Place 1 drop into the right eye 4 (four) times daily. 08/09/16  Yes Cuthriell, Delorise Royals, PA-C  omeprazole (PRILOSEC) 40 MG capsule Take 1 capsule (40 mg total) by mouth daily. 10/10/18 01/09/19 Yes [name], MD  ondansetron (ZOFRAN ODT) 4 MG disintegrating tablet Allow 1-2 tablets to dissolve in your mouth every 8 hours as needed for nausea/vomiting 11/02/16  Yes Loleta Rose, MD    promethazine (PHENERGAN) 12.5 MG tablet Take 1 tablet (12.5 mg total) by mouth every 6 (six) hours as needed for nausea or vomiting. 09/28/18  Yes Jene Every, MD    Allergies as of 10/10/2018  . (No Known Allergies)    Family History  Problem Relation Age of Onset  . Hypertension Mother   . Hypertension Father   . Hyperlipidemia Father     Social History   Socioeconomic History  . Marital status: Single    Spouse name: Not on file  . Number of children: Not on file  . Years of education: Not on file  . Highest education level: Not on file  Occupational History  . Not on file  Social Needs  . Financial resource strain: Not on file  . Food insecurity:    Worry: Not on file    Inability: Not on file  . Transportation needs:    Medical: Not on file    Non-medical: Not on file  Tobacco Use  . Smoking status: Current Every Day Smoker    Packs/day: 0.50    Types: Cigarettes  . Smokeless tobacco: Never Used  Substance and Sexual Activity  . Alcohol use: Yes    Alcohol/week: 3.0 standard drinks    Types: 3 Cans of beer per week  . Drug use: Yes    Types: Marijuana  . Sexual activity: Not on file  Lifestyle  . Physical activity:    Days per week: Not on file    Minutes per session: Not on file  .  Stress: Not on file  Relationships  . Social connections:    Talks on phone: Not on file    Gets together: Not on file    Attends religious service: Not on file    Active member of club or organization: Not on file    Attends meetings of clubs or organizations: Not on file    Relationship status: Not on file  . Intimate partner violence:    Fear of current or ex partner: Not on file    Emotionally abused: Not on file    Physically abused: Not on file    Forced sexual activity: Not on file  Other Topics Concern  . Not on file  Social History Narrative  . Not on file    Review of Systems: See HPI, otherwise negative ROS  Physical Exam: BP (!) 123/97   Pulse  85   Temp (!) 97 F (36.1 C) (Tympanic)   Resp 16   Ht 6\' 1"  (1.854 m)   Wt 63.5 kg   SpO2 100%   BMI 18.47 kg/m  General:   Alert,  pleasant and cooperative in NAD Head:  Normocephalic and atraumatic. Neck:  Supple; no masses or thyromegaly. Lungs:  Clear throughout to auscultation, normal respiratory effort.    Heart:  +S1, +S2, Regular rate and rhythm, No edema. Abdomen:  Soft, nontender and nondistended. Normal bowel sounds, without guarding, and without rebound.   Neurologic:  Alert and  oriented x4;  grossly normal neurologically.  Impression/Plan: Blake Kerr is here for an endoscopy and colonoscopy  to be performed for  evaluation of abdominal pain    Risks, benefits, limitations, and alternatives regarding endoscopy have been reviewed with the patient.  Questions have been answered.  All parties agreeable.   [name], MD  10/16/2018, 11:41 AM

## 2018-10-16 NOTE — Op Note (Signed)
[facility] Gastroenterology Patient Name: Blake Kerr Procedure Date: 10/16/2018 10:15 AM MRN: [id number] Account #: [id number] Date of Birth: 04-21-1976 Admit Type: Outpatient Age: 43 Room: [facility] ENDO ROOM 4 Gender: Male Note Status: Finalized Procedure:            Colonoscopy Indications:          Abdominal pain Providers:            [name] MD, MD Medicines:            Monitored Anesthesia Care Complications:        No immediate complications. Procedure:            Pre-Anesthesia Assessment:                       - Prior to the procedure, a History and Physical was                        performed, and patient medications, allergies and                        sensitivities were reviewed. The patient's tolerance of                        previous anesthesia was reviewed.                       - The risks and benefits of the procedure and the                        sedation options and risks were discussed with the                        patient. All questions were answered and informed                        consent was obtained.                       - ASA Grade Assessment: II - A patient with mild                        systemic disease.                       After obtaining informed consent, the colonoscope was                        passed under direct vision. Throughout the procedure,                        the patient's blood pressure, pulse, and oxygen                        saturations were monitored continuously. The                        Colonoscope was introduced through the anus and                        advanced to the the cecum, identified by the  appendiceal orifice, IC valve and transillumination.                        The colonoscopy was performed with ease. The patient                        tolerated the procedure well. The quality of the bowel                        preparation was good. Findings:      The  perianal and digital rectal examinations were normal.      The entire examined colon appeared normal on direct and retroflexion       views. Impression:           - The entire examined colon is normal on direct and                        retroflexion views.                       - No specimens collected. Recommendation:       - Discharge patient to home (with escort).                       - Resume previous diet.                       - Continue present medications.                       - Return to my office as previously scheduled. Procedure Code(s):    --- Professional ---                       [phone], Colonoscopy, flexible; diagnostic, including                        collection of specimen(s) by brushing or washing, when                        performed (separate procedure) Diagnosis Code(s):    --- Professional ---                       R10.9, Unspecified abdominal pain CPT copyright 2018 American Medical Association. All rights reserved. The codes documented in this report are preliminary and upon coder review may  be revised to meet current compliance requirements. [name], MD [name] MD, MD 10/16/2018 10:42:30 AM This report has been signed electronically. Number of Addenda: 0 Note Initiated On: 10/16/2018 10:15 AM Scope Withdrawal Time: 0 hours 6 minutes 49 seconds  Total Procedure Duration: 0 hours 10 minutes 14 seconds       [facility]

## 2018-10-16 NOTE — Addendum Note (Signed)
Addendum  created 10/16/18 1335 by Dava Najjar, CRNA   Intraprocedure Event edited, Intraprocedure Staff edited

## 2018-10-16 NOTE — Transfer of Care (Signed)
Immediate Anesthesia Transfer of Care Note  Patient: Blake Kerr  Procedure(s) Performed: COLONOSCOPY WITH PROPOFOL (N/A ) ESOPHAGOGASTRODUODENOSCOPY (EGD) WITH PROPOFOL (N/A )  Patient Location: PACU and Endoscopy Unit  Anesthesia Type:General  Level of Consciousness: drowsy  Airway & Oxygen Therapy: Patient Spontanous Breathing  Post-op Assessment: Report given to RN and Post -op Vital signs reviewed and stable  Post vital signs: Reviewed and stable  Last Vitals:  Vitals Value Taken Time  BP 105/66 10/16/2018 10:44 AM  Temp 36.1 C 10/16/2018 10:44 AM  Pulse    Resp 16 10/16/2018 10:44 AM  SpO2 97 % 10/16/2018 10:44 AM    Last Pain:  Vitals:   10/16/18 1044  TempSrc: Tympanic  PainSc: 0-No pain         Complications: No apparent anesthesia complications

## 2018-10-18 ENCOUNTER — Encounter: Payer: Self-pay | Admitting: Gastroenterology

## 2018-10-20 ENCOUNTER — Other Ambulatory Visit: Payer: Self-pay

## 2018-10-20 ENCOUNTER — Telehealth: Payer: Self-pay

## 2018-10-20 DIAGNOSIS — R7989 Other specified abnormal findings of blood chemistry: Secondary | ICD-10-CM

## 2018-10-20 NOTE — Telephone Encounter (Signed)
Spoke with pt and informed him of lab results and Dr. Johnney Killian instructions have additional lab test. Pt plans to come to our office for labs.

## 2018-10-20 NOTE — Telephone Encounter (Signed)
-----   Message from [name], MD sent at 10/11/2018  9:01 AM EST ----- Inform TSH low - check free t4 , t3 - send copy to referring physician

## 2019-01-03 ENCOUNTER — Telehealth: Payer: Self-pay | Admitting: Gastroenterology

## 2019-01-03 NOTE — Telephone Encounter (Signed)
Blake Kerr called for patient wanting a refill on  promethazine (PHENERGAN) 12.5 MG tablet   Called into the [facility] in [place]. She called the pharmacy & there are no refills left on medication.

## 2019-01-04 ENCOUNTER — Other Ambulatory Visit: Payer: Self-pay

## 2019-01-04 MED ORDER — PROMETHAZINE HCL 12.5 MG PO TABS: 12.5000 mg | ORAL_TABLET | Freq: Four times a day (QID) | ORAL | 2 refills | Status: AC | PRN

## 2019-01-04 NOTE — Telephone Encounter (Signed)
Prescription refill has been sent and pt is aware

## 2019-01-04 NOTE — Telephone Encounter (Signed)
Ok

## 2019-01-29 ENCOUNTER — Telehealth: Payer: Self-pay | Admitting: Gastroenterology

## 2019-01-29 NOTE — Telephone Encounter (Signed)
Pt Girlfriend left vm to get rx refilled for  promethazine (PHENERGAN) 12.5 MG tablet [facility] she states  It was send before but it was send back for some reason

## 2019-01-29 NOTE — Telephone Encounter (Signed)
Spoke with pt girlfriend, Martie Lee, regarding pt request for phenergan prescription. She states that the issue with the pharmacy has been resolved and pt was able to get the prescription filled.

## 2019-08-15 ENCOUNTER — Encounter: Payer: Self-pay | Admitting: Emergency Medicine

## 2019-08-15 ENCOUNTER — Other Ambulatory Visit: Payer: Self-pay

## 2019-08-15 ENCOUNTER — Emergency Department
Admission: EM | Admit: 2019-08-15 | Discharge: 2019-08-15 | Disposition: A | Discharge status: Home / Self Care | Payer: Self-pay | Attending: Emergency Medicine | Admitting: Emergency Medicine

## 2019-08-15 DIAGNOSIS — H00011 Hordeolum externum right upper eyelid: Secondary | ICD-10-CM | POA: Insufficient documentation

## 2019-08-15 DIAGNOSIS — Z79899 Other long term (current) drug therapy: Secondary | ICD-10-CM | POA: Insufficient documentation

## 2019-08-15 DIAGNOSIS — L03213 Periorbital cellulitis: Secondary | ICD-10-CM | POA: Insufficient documentation

## 2019-08-15 DIAGNOSIS — H00014 Hordeolum externum left upper eyelid: Secondary | ICD-10-CM

## 2019-08-15 DIAGNOSIS — F17210 Nicotine dependence, cigarettes, uncomplicated: Secondary | ICD-10-CM | POA: Insufficient documentation

## 2019-08-15 MED ORDER — AMOXICILLIN-POT CLAVULANATE 875-125 MG PO TABS: 1.0000 | ORAL_TABLET | Freq: Two times a day (BID) | ORAL | 0 refills | Status: DC

## 2019-08-15 MED ORDER — TETRACAINE HCL 0.5 % OP SOLN
2.0000 [drp] | Freq: Once | OPHTHALMIC | Status: AC
  Administered 2019-08-15: 2 [drp] via OPHTHALMIC
  Filled 2019-08-15: qty 4

## 2019-08-15 MED ORDER — FLUORESCEIN SODIUM 1 MG OP STRP
1.0000 | ORAL_STRIP | Freq: Once | OPHTHALMIC | Status: AC
  Administered 2019-08-15: 14:00:00 1 via OPHTHALMIC
  Filled 2019-08-15: qty 1

## 2019-08-15 MED ORDER — AMOXICILLIN-POT CLAVULANATE 875-125 MG PO TABS
1.0000 | ORAL_TABLET | Freq: Once | ORAL | Status: AC
  Administered 2019-08-15: 15:00:00 1 via ORAL
  Filled 2019-08-15: qty 1

## 2019-08-15 MED ORDER — POLYMYXIN B-TRIMETHOPRIM 10000-0.1 UNIT/ML-% OP SOLN: 2.0000 [drp] | OPHTHALMIC | 0 refills | Status: AC

## 2019-08-15 MED ORDER — CLINDAMYCIN HCL 300 MG PO CAPS: 300.0000 mg | ORAL_CAPSULE | Freq: Three times a day (TID) | ORAL | 0 refills | Status: DC

## 2019-08-15 MED ORDER — CLINDAMYCIN HCL 300 MG PO CAPS: 300.0000 mg | ORAL_CAPSULE | Freq: Three times a day (TID) | ORAL | 0 refills | Status: AC

## 2019-08-15 MED ORDER — AMOXICILLIN-POT CLAVULANATE 875-125 MG PO TABS: 1.0000 | ORAL_TABLET | Freq: Two times a day (BID) | ORAL | 0 refills | Status: AC

## 2019-08-15 MED ORDER — CLINDAMYCIN HCL 150 MG PO CAPS
300.0000 mg | ORAL_CAPSULE | Freq: Once | ORAL | Status: AC
  Administered 2019-08-15: 15:00:00 300 mg via ORAL
  Filled 2019-08-15: qty 2

## 2019-08-15 NOTE — Discharge Instructions (Signed)
Please use eyedrops and take oral antibiotics for the infection to your eyelid.  Please use warm compresses.  Please call ophthalmology for appointment for follow-up.

## 2019-08-15 NOTE — ED Provider Notes (Signed)
[facility] Emergency Department Provider Note  ____________________________________________  Time seen: Approximately 1:57 PM  I have reviewed the triage vital signs and the nursing notes.   HISTORY  Chief Complaint Eye Pain    HPI Blake Kerr is a 43 y.o. male that presents to the emergency department for evaluation of eye pain. 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 He immediately washed out his eye and thought he got everything out of his eye. Swelling started 3 days ago but was worse today. His eye was glued shut this morning. He is not having any visual changes. No photophobia. Eye is not red.    Past Medical History:  Diagnosis Date  . GERD (gastroesophageal reflux disease)     Patient Active Problem List   Diagnosis Date Noted  . Intractable nausea and vomiting 04/24/2016  . Sepsis ([place]) 04/23/2016  . Nausea vomiting and diarrhea 02/28/2015    Past Surgical History:  Procedure Laterality Date  . COLONOSCOPY WITH PROPOFOL N/A 10/16/2018   Procedure: COLONOSCOPY WITH PROPOFOL;  Surgeon: Jonathon Bellows, MD;  Location: [facility] ENDOSCOPY;  Service: Gastroenterology;  Laterality: N/A;  . ESOPHAGOGASTRODUODENOSCOPY Bilateral 03/02/2015   Procedure: ESOPHAGOGASTRODUODENOSCOPY (EGD);  Surgeon: Hulen Luster, MD;  Location: [facility] ENDOSCOPY;  Service: Gastroenterology;  Laterality: Bilateral;  . ESOPHAGOGASTRODUODENOSCOPY (EGD) WITH PROPOFOL N/A 10/16/2018   Procedure: ESOPHAGOGASTRODUODENOSCOPY (EGD) WITH PROPOFOL;  Surgeon: Jonathon Bellows, MD;  Location: [facility] ENDOSCOPY;  Service: Gastroenterology;  Laterality: N/A;    Prior to Admission medications   Medication Sig Start Date End Date Taking? Authorizing Provider  amoxicillin-clavulanate (AUGMENTIN) 875-125 MG tablet Take 1 tablet by mouth 2 (two) times daily for 7 days. 08/15/19 08/22/19  Laban Emperor, PA-C  capsaicin (ZOSTRIX) 0.025 % cream Apply topically 2 (two) times  daily. Apply to upper stomach region 08/23/18   Merlyn Lot, MD  clindamycin (CLEOCIN) 300 MG capsule Take 1 capsule (300 mg total) by mouth 3 (three) times daily for 7 days. 08/15/19 08/22/19  Laban Emperor, PA-C  cyclobenzaprine (FLEXERIL) 10 MG tablet Take 10 mg by mouth 2 (two) times daily as needed for muscle spasms. 03/29/16   [provider]  dicyclomine (BENTYL) 20 MG tablet Take 1 tablet (20 mg total) by mouth 3 (three) times daily as needed for spasms. 10/06/16   Quintella Reichert, MD  diphenhydrAMINE (BENADRYL) 25 mg capsule Take 2 capsules (50 mg total) by mouth every 6 (six) hours as needed. 10/21/16   Carrie Mew, MD  famotidine (PEPCID) 20 MG tablet Take 1 tablet (20 mg total) by mouth 2 (two) times daily. 10/21/16   Carrie Mew, MD  ketorolac (ACULAR) 0.5 % ophthalmic solution Place 1 drop into the right eye 4 (four) times daily. 08/09/16   Cuthriell, Charline Bills, PA-C  omeprazole (PRILOSEC) 40 MG capsule Take 1 capsule (40 mg total) by mouth daily. 10/10/18 01/09/19  Jonathon Bellows, MD  ondansetron (ZOFRAN ODT) 4 MG disintegrating tablet Allow 1-2 tablets to dissolve in your mouth every 8 hours as needed for nausea/vomiting 11/02/16   Hinda Kehr, MD  promethazine (PHENERGAN) 12.5 MG tablet Take 1 tablet (12.5 mg total) by mouth every 6 (six) hours as needed for nausea or vomiting. 01/04/19   Jonathon Bellows, MD  trimethoprim-polymyxin b (POLYTRIM) ophthalmic solution Place 2 drops into the right eye every 4 (four) hours. 08/15/19   Laban Emperor, PA-C    Allergies Patient has no known allergies.  Family History  Problem  Relation Age of Onset  . Hypertension Mother   . Hypertension Father   . Hyperlipidemia Father     Social History Social History   Tobacco Use  . Smoking status: Current Every Day Smoker    Packs/day: 0.50    Types: Cigarettes  . Smokeless tobacco: Never Used  Substance Use Topics  . Alcohol use: Yes  . Drug use: Yes    Types: Marijuana      Review of Systems  Constitutional: No fever/chills Cardiovascular: No chest pain. Respiratory: No SOB. Gastrointestinal: No nausea, no vomiting.  Musculoskeletal: Negative for musculoskeletal pain. Skin: Negative for rash, abrasions, lacerations, ecchymosis. Neurological: Negative for headaches   ____________________________________________   PHYSICAL EXAM:  VITAL SIGNS: ED Triage Vitals  Enc Vitals Group     BP 08/15/19 1345 (!) 157/89     Pulse Rate 08/15/19 1345 87     Resp 08/15/19 1345 16     Temp 08/15/19 1345 98.3 F (36.8 C)     Temp Source 08/15/19 1345 Oral     SpO2 08/15/19 1345 98 %     Weight 08/15/19 1337 170 lb (77.1 kg)     Height 08/15/19 1337 6' (1.829 m)     Head Circumference --      Peak Flow --      Pain Score 08/15/19 1337 6     Pain Loc --      Pain Edu? --      Excl. in GC? --      Constitutional: Alert and oriented. Well appearing and in no acute distress. Eyes: Conjunctivae are normal. PERRL. EOMI. Swelling to right upper eyelid. Purulent drainage draining from 28mm skin defect to medial inner eyelid on eyelid inversion. No defect on fluorescence stain. Head: Atraumatic. ENT:      Ears:      Nose: No congestion/rhinnorhea.      Mouth/Throat: Mucous membranes are moist.  Neck: No stridor.   Cardiovascular: Normal rate, regular rhythm.  Good peripheral circulation. Respiratory: Normal respiratory effort without tachypnea or retractions. Lungs CTAB. Good air entry to the bases with no decreased or absent breath sounds. Musculoskeletal: Full range of motion to all extremities. No gross deformities appreciated. Neurologic:  Normal speech and language. No gross focal neurologic deficits are appreciated.  Skin:  Skin is warm, dry and intact. No rash noted. Psychiatric: Mood and affect are normal. Speech and behavior are normal. Patient exhibits appropriate insight and judgement.   ____________________________________________    LABS (all labs ordered are listed, but only abnormal results are displayed)  Labs Reviewed - No data to display ____________________________________________  EKG   ____________________________________________  RADIOLOGY  No results found.  ____________________________________________    PROCEDURES  Procedure(s) performed:    Procedures    Medications  fluorescein ophthalmic strip 1 strip (1 strip Right Eye Given by Other 08/15/19 1408)  tetracaine (PONTOCAINE) 0.5 % ophthalmic solution 2 drop (2 drops Right Eye Given 08/15/19 1408)  amoxicillin-clavulanate (AUGMENTIN) 875-125 MG per tablet 1 tablet (1 tablet Oral Given 08/15/19 1505)  clindamycin (CLEOCIN) capsule 300 mg (300 mg Oral Given 08/15/19 1505)     ____________________________________________   INITIAL IMPRESSION / ASSESSMENT AND PLAN / ED COURSE  Pertinent labs & imaging results that were available during my care of the patient were reviewed by me and considered in my medical decision making (see chart for details).  Review of the [place] CSRS was performed in accordance of the [facility] prior to dispensing any controlled drugs.  Patient's diagnosis is consistent with preseptal cellulitis. Patient will be discharged home with prescriptions for clindamycin, augmentin and polytrim. Patient is to follow up with opthamology as directed. Referral was given to Dr. Lara MulchHarrow. Patient is given ED precautions to return to the ED for any worsening or new symptoms.   Blake Kerr was evaluated in Emergency Department on 08/15/2019 for the symptoms described in the history of present illness. He was evaluated in the context of the global COVID-19 pandemic, which necessitated consideration that the patient might be at risk for infection with the SARS-CoV-2 virus that causes COVID-19. Institutional protocols and algorithms that pertain to the evaluation of patients at risk for COVID-19 are in a state of rapid change based on  information released by regulatory bodies including the CDC and federal and state organizations. These policies and algorithms were followed during the patient's care in the ED.  ____________________________________________  FINAL CLINICAL IMPRESSION(S) / ED DIAGNOSES  Final diagnoses:  Preseptal cellulitis  Hordeolum externum of left upper eyelid      NEW MEDICATIONS STARTED DURING THIS VISIT:  ED Discharge Orders         Ordered    clindamycin (CLEOCIN) 300 MG capsule  3 times daily,   Status:  Discontinued     08/15/19 1525    amoxicillin-clavulanate (AUGMENTIN) 875-125 MG tablet  2 times daily,   Status:  Discontinued     08/15/19 1525    trimethoprim-polymyxin b (POLYTRIM) ophthalmic solution  Every 4 hours     08/15/19 1525    clindamycin (CLEOCIN) 300 MG capsule  3 times daily     08/15/19 1525    amoxicillin-clavulanate (AUGMENTIN) 875-125 MG tablet  2 times daily     08/15/19 1525              This chart was dictated using voice recognition software/Dragon. Despite best efforts to proofread, errors can occur which can change the meaning. Any change was purely unintentional.    Enid DerryWagner, Jacqueleen Pulver, PA-C 08/15/19 Lacie [id number]    Paduchowski, Kevin, MD 08/16/19 2224

## 2019-08-15 NOTE — ED Triage Notes (Signed)
Pt in via POV, reports getting something in eye a few days ago at work, waking up this morning with pain/swelling to left eye lid.  Ambulatory to triage; NAD noted at this time.

## 2019-11-26 ENCOUNTER — Other Ambulatory Visit: Payer: Self-pay | Admitting: Gastroenterology

## 2020-01-16 ENCOUNTER — Other Ambulatory Visit: Payer: Self-pay

## 2020-01-16 ENCOUNTER — Emergency Department
Admission: EM | Admit: 2020-01-16 | Discharge: 2020-01-16 | Disposition: A | Discharge status: Home / Self Care | Payer: Self-pay | Attending: Emergency Medicine | Admitting: Emergency Medicine

## 2020-01-16 ENCOUNTER — Emergency Department: Payer: Self-pay

## 2020-01-16 ENCOUNTER — Encounter: Payer: Self-pay | Admitting: Emergency Medicine

## 2020-01-16 DIAGNOSIS — R1115 Cyclical vomiting syndrome unrelated to migraine: Secondary | ICD-10-CM | POA: Insufficient documentation

## 2020-01-16 DIAGNOSIS — F17210 Nicotine dependence, cigarettes, uncomplicated: Secondary | ICD-10-CM | POA: Insufficient documentation

## 2020-01-16 LAB — LIPASE, BLOOD: Lipase: 16 U/L (ref 11–51)

## 2020-01-16 LAB — COMPREHENSIVE METABOLIC PANEL
ALT: 24 U/L (ref 0–44)
AST: 17 U/L (ref 15–41)
Albumin: 4.9 g/dL (ref 3.5–5.0)
Alkaline Phosphatase: 71 U/L (ref 38–126)
Anion gap: 9 (ref 5–15)
BUN: 29 mg/dL — ABNORMAL HIGH (ref 6–20)
CO2: 30 mmol/L (ref 22–32)
Calcium: 9.9 mg/dL (ref 8.9–10.3)
Chloride: 92 mmol/L — ABNORMAL LOW (ref 98–111)
Creatinine, Ser: 0.86 mg/dL (ref 0.61–1.24)
GFR calc Af Amer: >60 mL/min (ref >60)
GFR calc non Af Amer: >60 mL/min (ref >60)
Glucose, Bld: 143 mg/dL — ABNORMAL HIGH (ref 70–99)
Potassium: 3.6 mmol/L (ref 3.5–5.1)
Sodium: 131 mmol/L — ABNORMAL LOW (ref 135–145)
Total Bilirubin: 1.5 mg/dL — ABNORMAL HIGH (ref 0.3–1.2)
Total Protein: 8.2 g/dL — ABNORMAL HIGH (ref 6.5–8.1)

## 2020-01-16 LAB — CBC
HCT: 47.6 % (ref 39.0–52.0)
Hemoglobin: 17.1 g/dL — ABNORMAL HIGH (ref 13.0–17.0)
MCH: 31.5 pg (ref 26.0–34.0)
MCHC: 35.9 g/dL (ref 30.0–36.0)
MCV: 87.7 fL (ref 80.0–100.0)
Platelets: 299 10*3/uL (ref 150–400)
RBC: 5.43 MIL/uL (ref 4.22–5.81)
RDW: 12.1 % (ref 11.5–15.5)
WBC: 12 10*3/uL — ABNORMAL HIGH (ref 4.0–10.5)
nRBC: 0 % (ref 0.0–0.2)

## 2020-01-16 MED ORDER — ONDANSETRON HCL 4 MG PO TABS: 4.0000 mg | ORAL_TABLET | Freq: Every day | ORAL | 1 refills | Status: DC | PRN

## 2020-01-16 MED ORDER — SODIUM CHLORIDE 0.9% FLUSH
3.0000 mL | Freq: Once | INTRAVENOUS | Status: AC
  Administered 2020-01-16: 3 mL via INTRAVENOUS

## 2020-01-16 MED ORDER — HALOPERIDOL 2 MG PO TABS: 2.0000 mg | ORAL_TABLET | Freq: Two times a day (BID) | ORAL | 0 refills | Status: DC | PRN

## 2020-01-16 MED ORDER — ONDANSETRON 4 MG PO TBDP
4.0000 mg | ORAL_TABLET | Freq: Once | ORAL | Status: AC | PRN
  Administered 2020-01-16: 06:00:00 4 mg via ORAL
  Filled 2020-01-16: qty 1

## 2020-01-16 MED ORDER — DROPERIDOL 2.5 MG/ML IJ SOLN
5.0000 mg | Freq: Once | INTRAMUSCULAR | Status: AC
  Administered 2020-01-16: 07:00:00 5 mg via INTRAVENOUS
  Filled 2020-01-16: qty 2

## 2020-01-16 MED ORDER — SODIUM CHLORIDE 0.9 % IV SOLN: Freq: Once | INTRAVENOUS | Status: AC

## 2020-01-16 MED ORDER — ONDANSETRON HCL 4 MG/2ML IJ SOLN
4.0000 mg | Freq: Once | INTRAMUSCULAR | Status: AC
  Administered 2020-01-16: 07:00:00 4 mg via INTRAVENOUS
  Filled 2020-01-16: qty 2

## 2020-01-16 NOTE — ED Provider Notes (Signed)
[facility] Emergency Department Provider Note       Time seen: ----------------------------------------- 7:10 AM on 01/16/2020 -----------------------------------------   I have reviewed the triage vital signs and the nursing notes.  HISTORY   Chief Complaint Nausea, Emesis, and Abdominal Pain    HPI Blake Kerr is a 44 y.o. male with a history of GERD, intractable nausea vomiting who presents to the ED for nausea vomiting since Sunday.  Patient states he vomited more than 10 times in last 24 hours, unable to keep anything down.  Pain is 7 out of 10 in the abdomen.  Past Medical History:  Diagnosis Date  . GERD (gastroesophageal reflux disease)     Patient Active Problem List   Diagnosis Date Noted  . Intractable nausea and vomiting 04/24/2016  . Sepsis (HCC) 04/23/2016  . Nausea vomiting and diarrhea 02/28/2015    Past Surgical History:  Procedure Laterality Date  . COLONOSCOPY WITH PROPOFOL N/A 10/16/2018   Procedure: COLONOSCOPY WITH PROPOFOL;  Surgeon: [name], MD;  Location: [facility] ENDOSCOPY;  Service: Gastroenterology;  Laterality: N/A;  . ESOPHAGOGASTRODUODENOSCOPY Bilateral 03/02/2015   Procedure: ESOPHAGOGASTRODUODENOSCOPY (EGD);  Surgeon: Wallace Cullens, MD;  Location: [facility] ENDOSCOPY;  Service: Gastroenterology;  Laterality: Bilateral;  . ESOPHAGOGASTRODUODENOSCOPY (EGD) WITH PROPOFOL N/A 10/16/2018   Procedure: ESOPHAGOGASTRODUODENOSCOPY (EGD) WITH PROPOFOL;  Surgeon: [name], MD;  Location: [facility] ENDOSCOPY;  Service: Gastroenterology;  Laterality: N/A;    Allergies Patient has no known allergies.  Social History Social History   Tobacco Use  . Smoking status: Current Every Day Smoker    Packs/day: 0.50    Types: Cigarettes  . Smokeless tobacco: Never Used  Substance Use Topics  . Alcohol use: Yes    Alcohol/week: 3.0 standard drinks    Types: 3 Cans of beer per week  . Drug use: Yes    Types: Marijuana    Review of  Systems Constitutional: Negative for fever. Cardiovascular: Negative for chest pain. Respiratory: Negative for shortness of breath. Gastrointestinal: Positive for abdominal pain, nausea vomiting Musculoskeletal: Negative for back pain. Skin: Negative for rash. Neurological: Negative for headaches, focal weakness or numbness.  All systems negative/normal/unremarkable except as stated in the HPI  ____________________________________________   PHYSICAL EXAM:  VITAL SIGNS: ED Triage Vitals [01/16/20 0606]  Enc Vitals Group     BP 101/79     Pulse Rate 100     Resp 18     Temp 97.9 F (36.6 C)     Temp Source Oral     SpO2 96 %     Weight 150 lb (68 kg)     Height 6\' 1"  (1.854 m)     Head Circumference      Peak Flow      Pain Score 7     Pain Loc      Pain Edu?      Excl. in GC?     Constitutional: Alert and oriented.  Mild to moderate distress Eyes: Conjunctivae are normal. Normal extraocular movements. Cardiovascular: Normal rate, regular rhythm. No murmurs, rubs, or gallops. Respiratory: Normal respiratory effort without tachypnea nor retractions. Breath sounds are clear and equal bilaterally. No wheezes/rales/rhonchi. Gastrointestinal: Nonfocal tenderness, possible guarding, normal bowel sounds Musculoskeletal: Nontender with normal range of motion in extremities. No lower extremity tenderness nor edema. Neurologic:  Normal speech and language. No gross focal neurologic deficits are appreciated.  Skin:  Skin is warm, dry and intact. No rash noted. Psychiatric: Mood and affect are normal. Speech and  behavior are normal.  ____________________________________________  ED COURSE:  As part of my medical decision making, I reviewed the following data within the [place] History obtained from family if available, nursing notes, old chart and ekg, as well as notes from prior ED visits. Patient presented for abdominal pain with nausea vomiting, we will  assess with labs and imaging as indicated at this time. Clinical Course as of Jan 15 950  Wed Jan 16, 2020  0867 Patient resting comfortably at this time, no acute distress   [JW]    Clinical Course User Index [JW] Earleen Newport, MD   Procedures  Quin Hoop Lalor was evaluated in Emergency Department on 01/16/2020 for the symptoms described in the history of present illness. He was evaluated in the context of the global COVID-19 pandemic, which necessitated consideration that the patient might be at risk for infection with the SARS-CoV-2 virus that causes COVID-19. Institutional protocols and algorithms that pertain to the evaluation of patients at risk for COVID-19 are in a state of rapid change based on information released by regulatory bodies including the CDC and federal and state organizations. These policies and algorithms were followed during the patient's care in the ED.  ____________________________________________   LABS (pertinent positives/negatives)  Labs Reviewed  COMPREHENSIVE METABOLIC PANEL - Abnormal; Notable for the following components:      Result Value   Sodium 131 (*)    Chloride 92 (*)    Glucose, Bld 143 (*)    BUN 29 (*)    Total Protein 8.2 (*)    Total Bilirubin 1.5 (*)    All other components within normal limits  CBC - Abnormal; Notable for the following components:   WBC 12.0 (*)    Hemoglobin 17.1 (*)    All other components within normal limits  LIPASE, BLOOD  URINALYSIS, COMPLETE (UACMP) WITH MICROSCOPIC    RADIOLOGY  Abdomen 2 view Is unremarkable ____________________________________________   DIFFERENTIAL DIAGNOSIS   Gastritis, gastroenteritis, cyclic vomiting syndrome, hyperemesis cannabinoid syndrome, dehydration, electrolyte abnormality  FINAL ASSESSMENT AND PLAN  Cyclic vomiting syndrome   Plan: The patient had presented for intractable vomiting. Patient's labs not reveal any acute process. Patient's imaging did not  reveal any acute process.  Patient has slept well and is feeling better.  Patient be discharged with antiemetics and is cleared for outpatient follow-up with GI.   Laurence Aly, MD    Note: This note was generated in part or whole with voice recognition software. Voice recognition is usually quite accurate but there are transcription errors that can and very often do occur. I apologize for any typographical errors that were not detected and corrected.     Earleen Newport, MD 01/16/20 [id number]

## 2020-01-16 NOTE — ED Triage Notes (Signed)
Pt to ED from home c/o nausea and vomiting since Sunday, emesis more than 10 times in 24 hours, states unable to keep anything down, urine dark, also c/o abd pain.

## 2020-04-17 ENCOUNTER — Other Ambulatory Visit: Payer: Self-pay | Admitting: Emergency Medicine

## 2020-04-24 ENCOUNTER — Other Ambulatory Visit: Payer: Self-pay | Admitting: Emergency Medicine

## 2020-08-22 IMAGING — CT CT ABD-PELV W/ CM
2 of 5 series · 16 of 46 positions shown, 18 images · IV contrast (APPLIED)
Comparison: 08/11/2016

CLINICAL DATA: Umbilical pain

EXAM:
CT ABDOMEN AND PELVIS WITH CONTRAST
TECHNIQUE: Multidetector CT imaging of the abdomen and pelvis was performed
using the standard protocol following bolus administration of
intravenous contrast.
CONTRAST:  100mL OMNIPAQUE IOHEXOL 300 MG/ML  SOLN

[Series 2: routine abd/pel with · axial · 0.67mm/px · z∈[-1124,-724]mm · 13 of 92 slices shown, 15 images]
[im 6/92  soft-tissue]
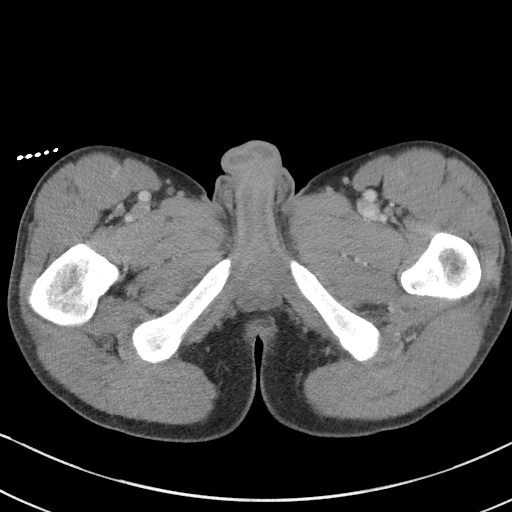
[im 6/92  bone]
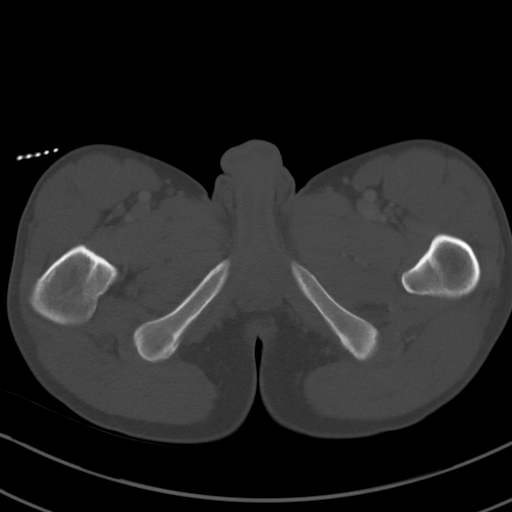
[im 11/92  soft-tissue]
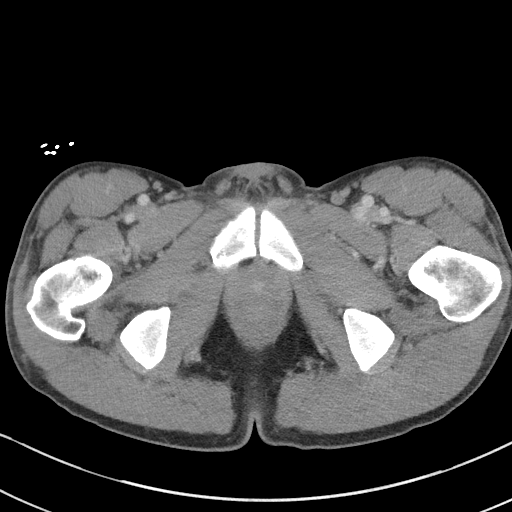
[im 21/92  soft-tissue]
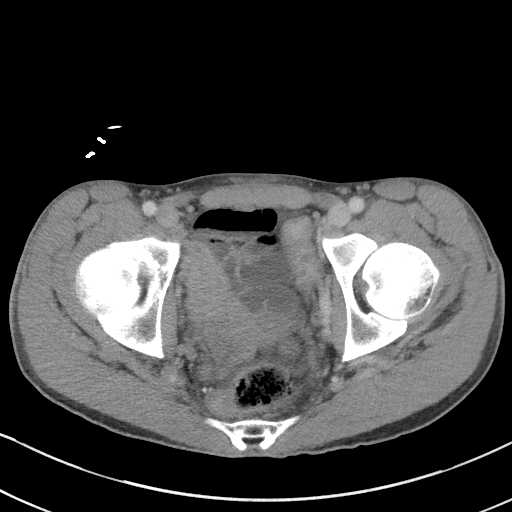
[im 26/92  soft-tissue]
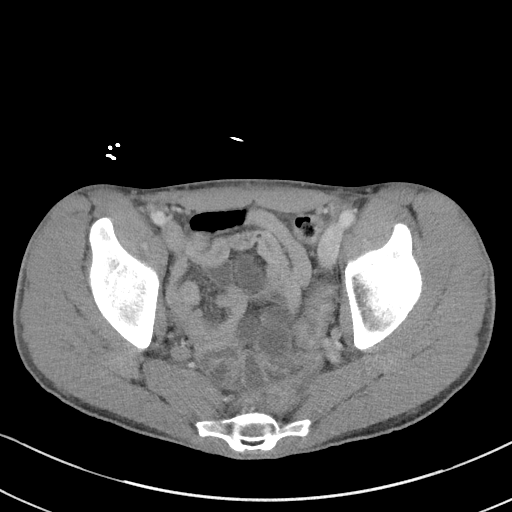
[im 31/92  soft-tissue]
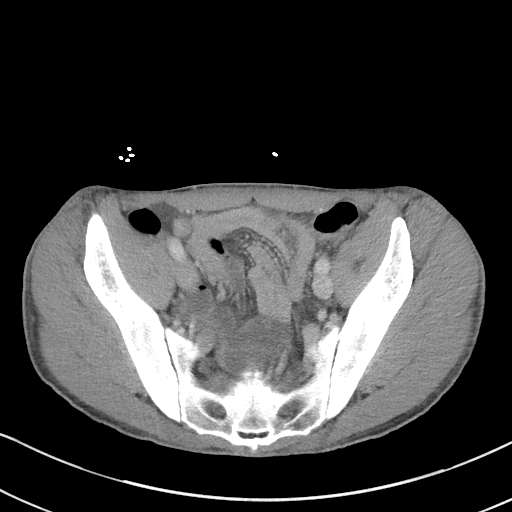
[im 41/92  soft-tissue]
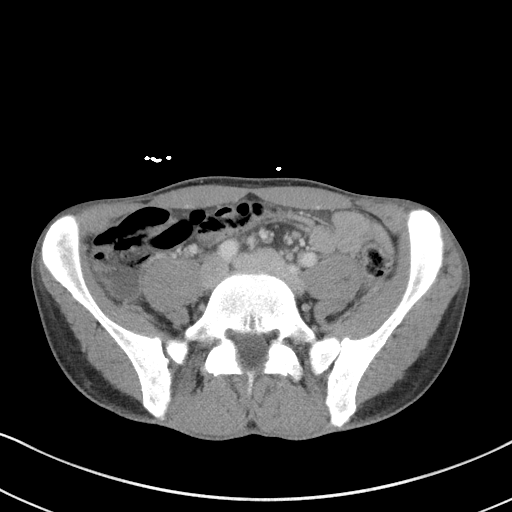
[im 46/92  soft-tissue]
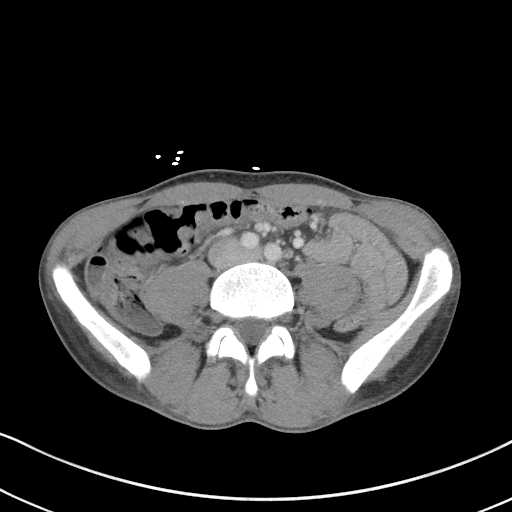
[im 51/92  soft-tissue]
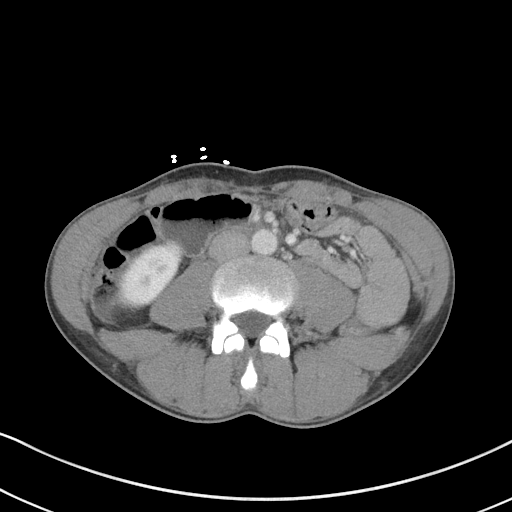
[im 61/92  soft-tissue]
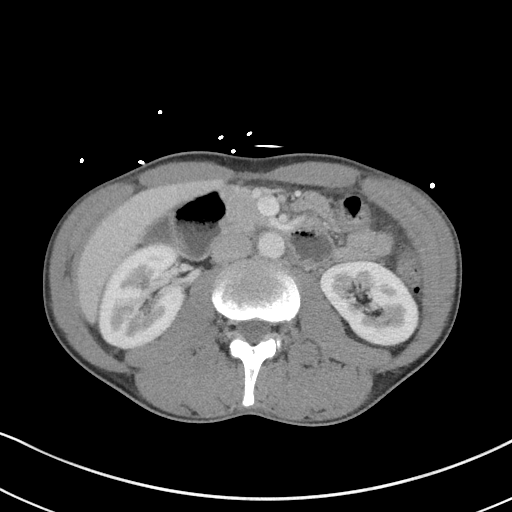
[im 61/92  bone]
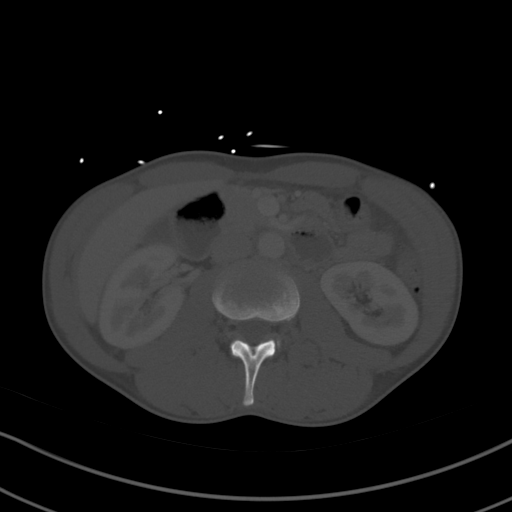
[im 66/92  soft-tissue]
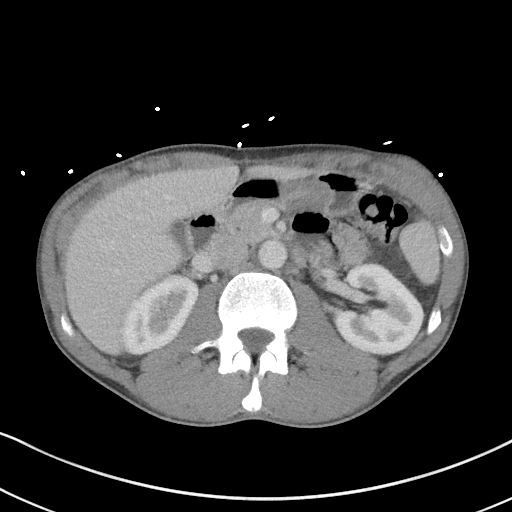
[im 71/92  soft-tissue]
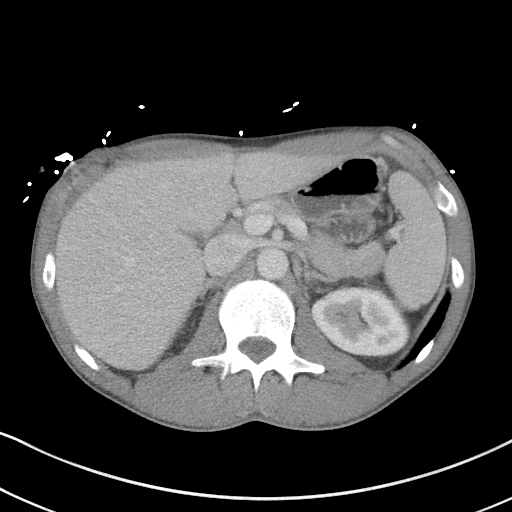
[im 81/92  soft-tissue]
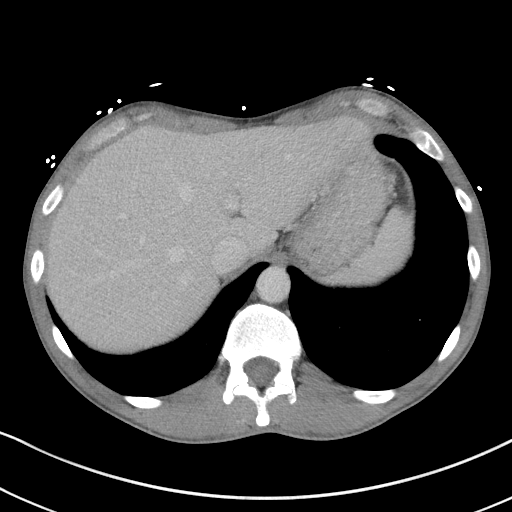
[im 86/92  soft-tissue]
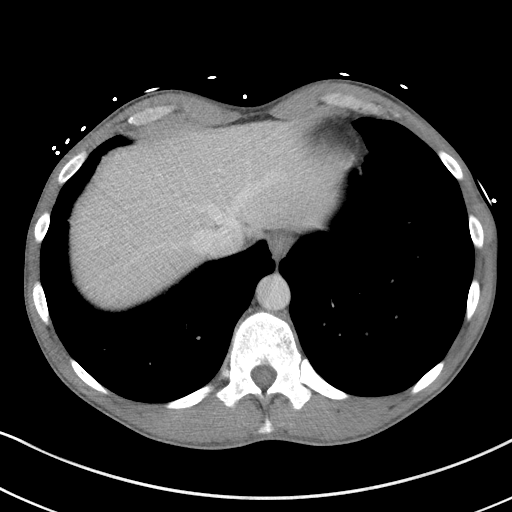

[Series 5: coronal st · coronal · 0.69mm/px · 3 of 80 slices shown]
[im 27/80  soft-tissue]
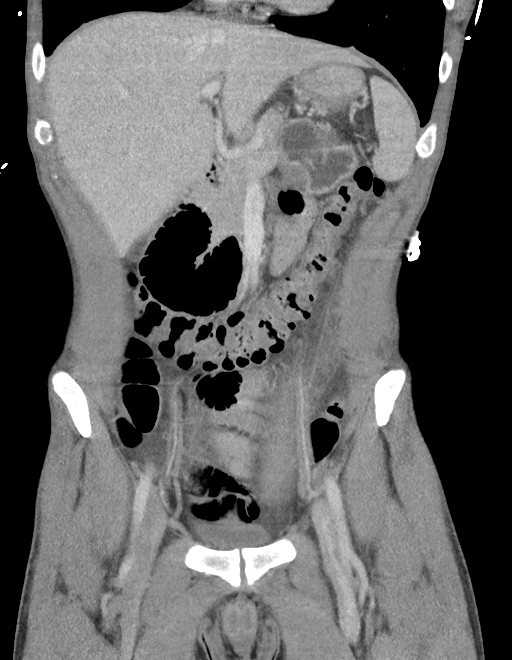
[im 36/80  soft-tissue]
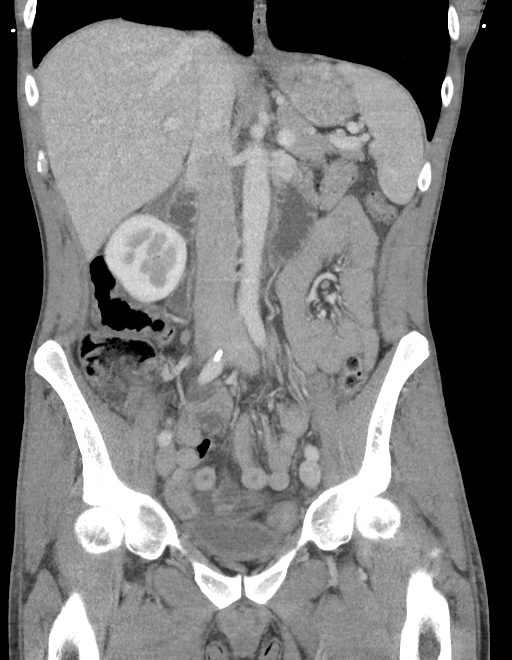
[im 44/80  soft-tissue]
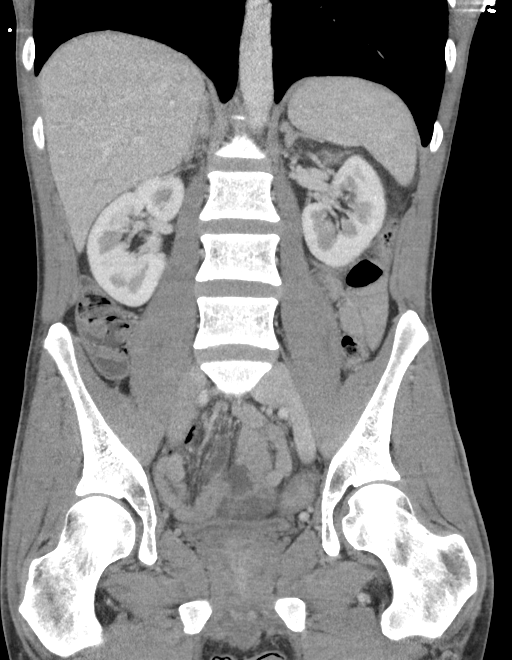

[16 of 46 positions shown; findings below may reference images not displayed]

FINDINGS: Lower chest: Lung bases are clear. No effusions. Heart is normal
size.

Hepatobiliary: No focal hepatic abnormality. Gallbladder
unremarkable.

Pancreas: No focal abnormality or ductal dilatation.

Spleen: No focal abnormality.  Normal size.

Adrenals/Urinary Tract: No adrenal abnormality. No focal renal
abnormality. No stones or hydronephrosis. Urinary bladder is
unremarkable.

Stomach/Bowel: Normal appendix. Stomach, large and small bowel
grossly unremarkable.

Vascular/Lymphatic: No evidence of aneurysm or adenopathy.

Reproductive: No visible focal abnormality.

Other: No free fluid or free air.

Musculoskeletal: No acute bony abnormality.
IMPRESSION: No acute findings in the abdomen or pelvis.

## 2021-12-09 IMAGING — CR DG ABDOMEN 2V
1 series · 3 of 3 positions shown · non-contrast
Comparison: None.

CLINICAL DATA: Abdominal pain, vomiting

EXAM:
ABDOMEN - 2 VIEW

[Series 1: dg abd 2 views · 0.14mm/px · 3 of 3 slices shown]
[im 1/3]
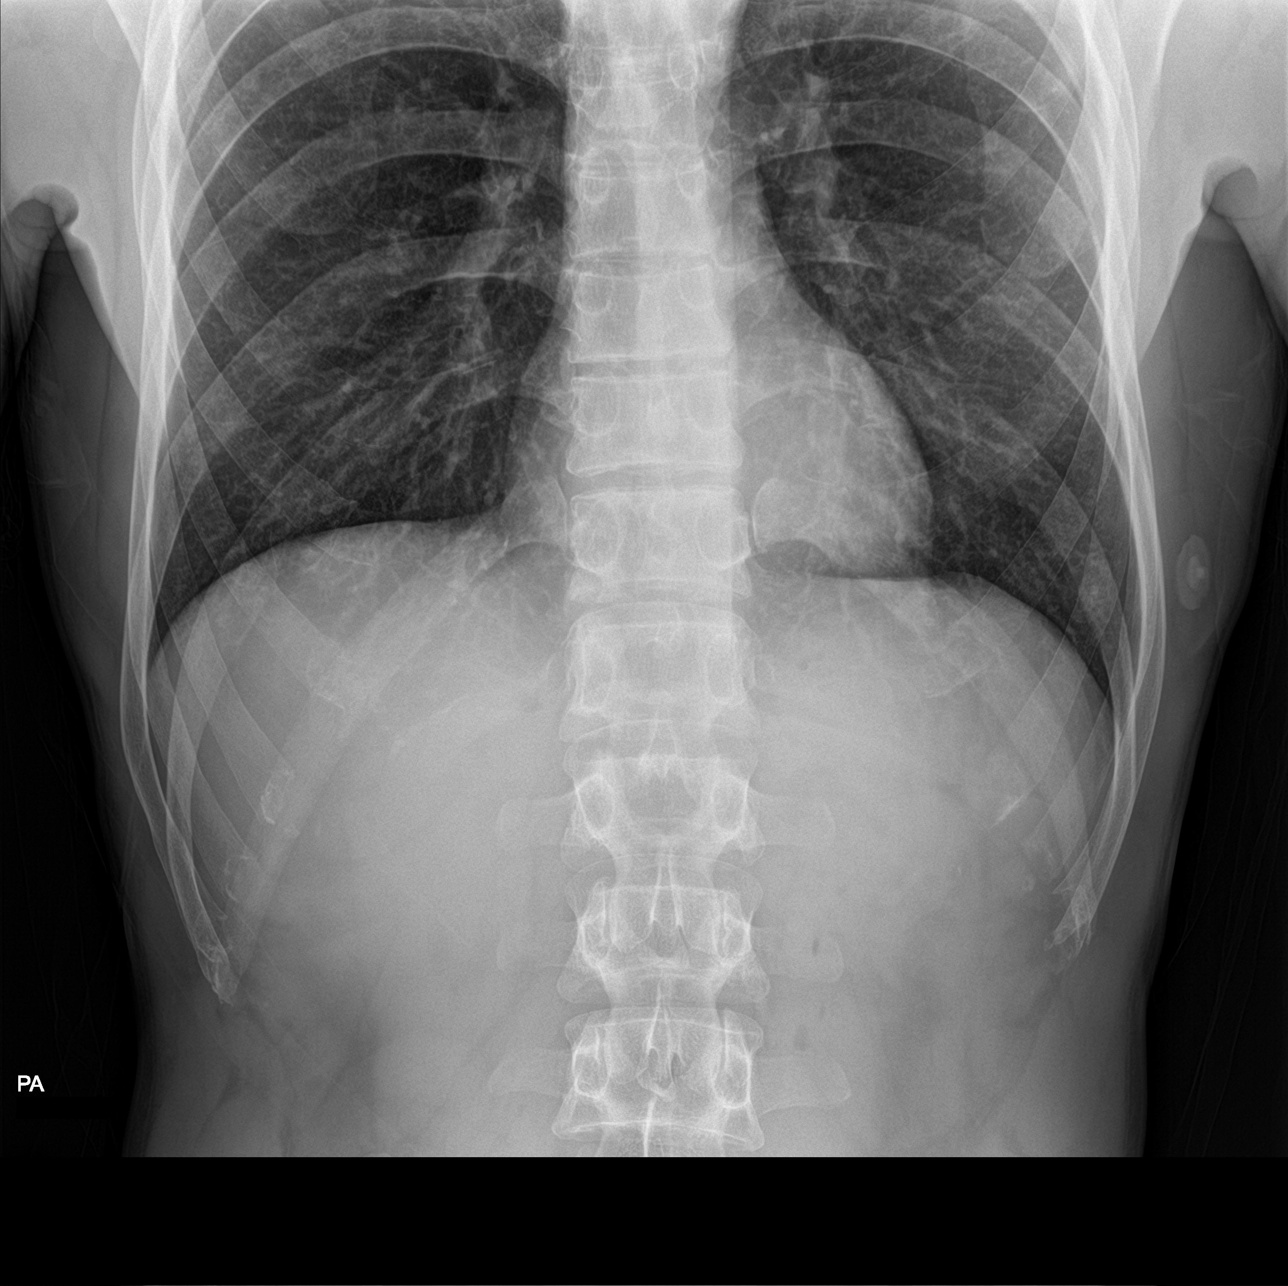
[im 2/3]
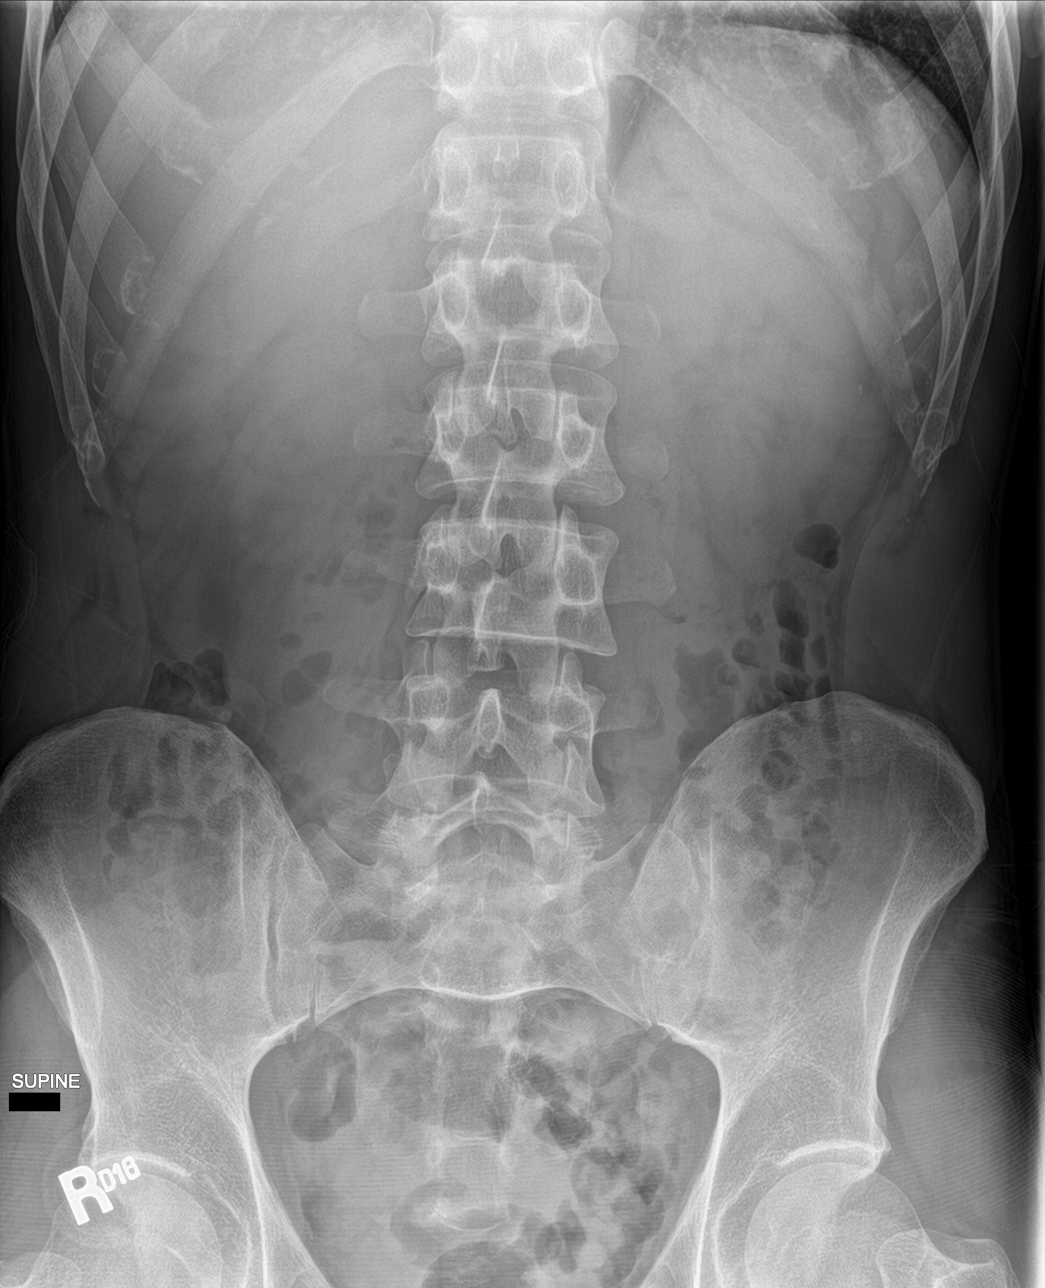
[im 3/3]
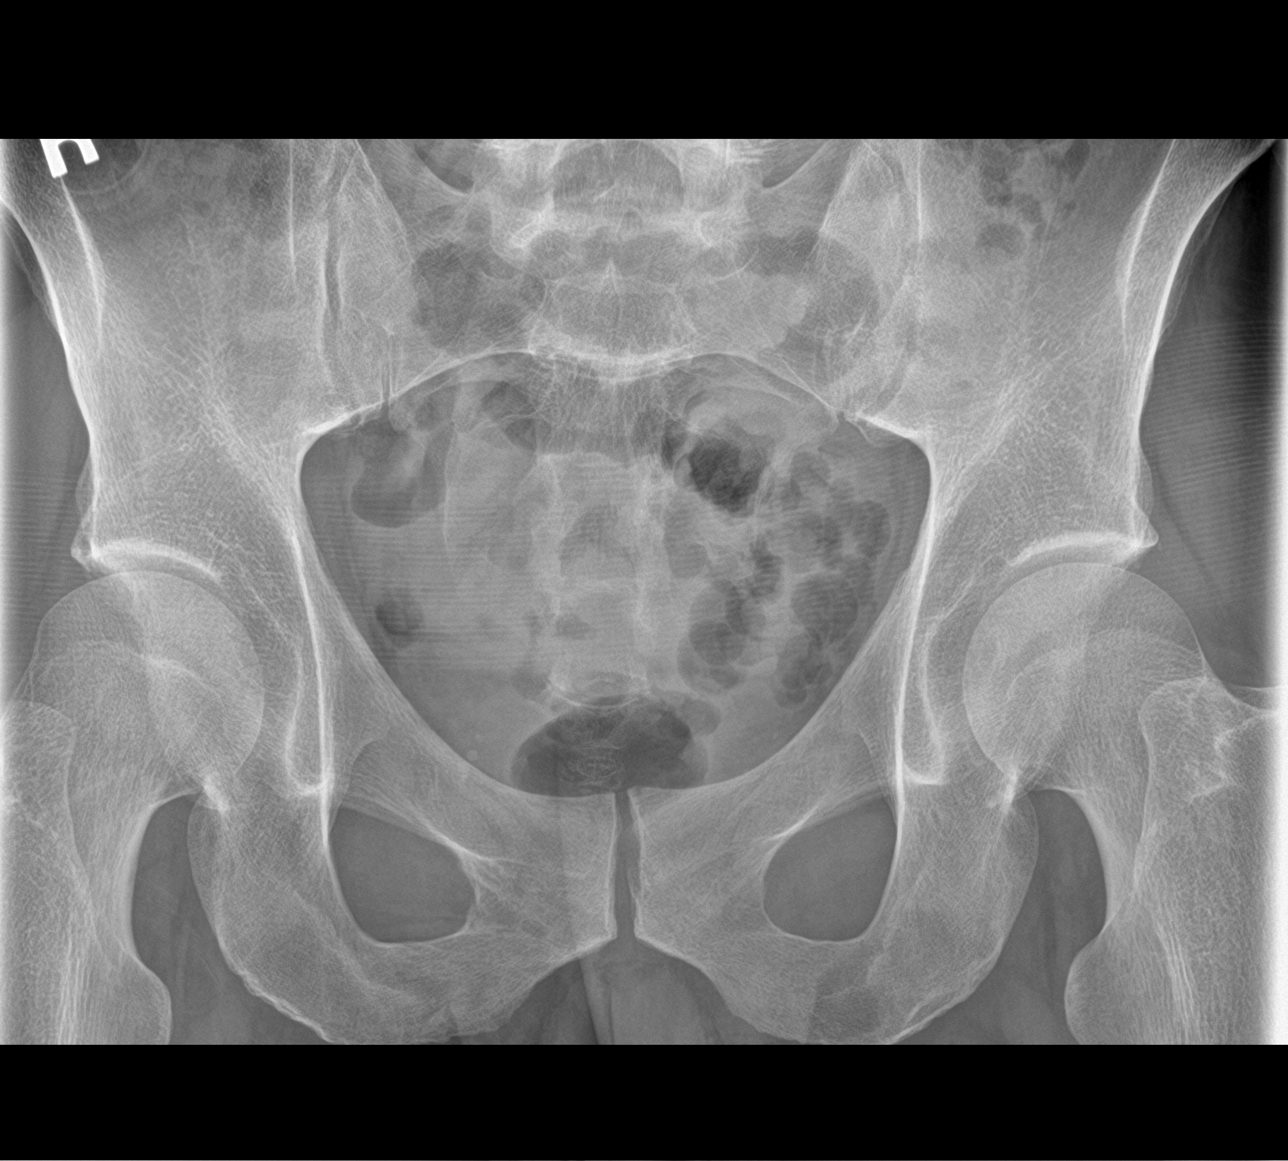

[3 of 3 positions shown; findings below may reference images not displayed]

FINDINGS: The bowel gas pattern is normal. There is no evidence of free air.
No radio-opaque calculi or other significant radiographic
abnormality is seen.
IMPRESSION: Negative.

## 2022-08-03 ENCOUNTER — Other Ambulatory Visit: Payer: Self-pay

## 2022-08-03 ENCOUNTER — Emergency Department
Admission: EM | Admit: 2022-08-03 | Discharge: 2022-08-03 | Disposition: A | Discharge status: Home / Self Care | Payer: Self-pay | Attending: Emergency Medicine | Admitting: Emergency Medicine

## 2022-08-03 ENCOUNTER — Emergency Department: Payer: Self-pay

## 2022-08-03 ENCOUNTER — Encounter: Payer: Self-pay | Admitting: Emergency Medicine

## 2022-08-03 DIAGNOSIS — M25512 Pain in left shoulder: Secondary | ICD-10-CM | POA: Insufficient documentation

## 2022-08-03 MED ORDER — PREDNISONE 10 MG PO TABS: ORAL_TABLET | ORAL | 0 refills | Status: AC

## 2022-08-03 NOTE — ED Triage Notes (Signed)
Pt sts that he has been having left shoulder pain for the last week. Pt sts that he can hardly raise his arm above his head. Pt sts that if he moves it wrong it feels like it is coming out of the socket.

## 2022-08-03 NOTE — ED Provider Notes (Signed)
[facility] Provider Note    Event Date/Time   First MD Initiated Contact with Patient 08/03/22 1247     (approximate)   History   Shoulder Injury   HPI  TREAVOR BLOMQUIST is a 46 y.o. male   presents to the ED with complaint of left shoulder pain for 1-1/2 weeks.  Patient states there was no history of any injury and he has not taken any over-the-counter medication.  Patient reports that range of motion especially reaching above his head has gradually become more difficult.  Patient has a history of GERD.      Physical Exam   Triage Vital Signs: ED Triage Vitals  Enc Vitals Group     BP 08/03/22 1229 111/68     Pulse Rate 08/03/22 1229 78     Resp 08/03/22 1229 17     Temp 08/03/22 1229 98.6 F (37 C)     Temp Source 08/03/22 1229 Oral     SpO2 08/03/22 1229 97 %     Weight 08/03/22 1230 160 lb (72.6 kg)     Height 08/03/22 1243 6\' 1"  (1.854 m)     Head Circumference --      Peak Flow --      Pain Score 08/03/22 1230 5     Pain Loc --      Pain Edu? --      Excl. in GC? --     Most recent vital signs: Vitals:   08/03/22 1229  BP: 111/68  Pulse: 78  Resp: 17  Temp: 98.6 F (37 C)  SpO2: 97%     General: Awake, no distress.  CV:  Good peripheral perfusion.  Resp:  Normal effort.  Abd:  No distention.  Other:  Left shoulder no gross deformities noted.  On range of motion there is no crepitus.  Patient is limited on abduction as it increases his pain.  Radial pulses present.     ED Results / Procedures / Treatments   Labs (all labs ordered are listed, but only abnormal results are displayed) Labs Reviewed - No data to display    RADIOLOGY Left shoulder x-ray images were reviewed and interpreted by myself independently and was negative for fracture, degenerative changes or dislocation.    PROCEDURES:  Critical Care performed:   Procedures   MEDICATIONS ORDERED IN ED: Medications - No data to  display   IMPRESSION / MDM / ASSESSMENT AND PLAN / ED COURSE  I reviewed the triage vital signs and the nursing notes.   Differential diagnosis includes, but is not limited to, bursitis, tendinitis, degenerative joint disease, dislocation.  46 year old male presents to the ED with complaint of left shoulder pain for 1-1/2 weeks without history of injury.  X-rays were reassuring and patient was made aware.  A prescription for prednisone was sent to the pharmacy to begin taking a 6-day taper.  He is encouraged to use ice or heat to his shoulder as needed for discomfort.  He is to follow-up with Dr. 59 if any continued problems or not improving.      Patient's presentation is most consistent with acute complicated illness / injury requiring diagnostic workup.  FINAL CLINICAL IMPRESSION(S) / ED DIAGNOSES   Final diagnoses:  Acute pain of left shoulder     Rx / DC Orders   ED Discharge Orders          Ordered    predniSONE (DELTASONE) 10 MG tablet  08/03/22 1322             Note:  This document was prepared using Dragon voice recognition software and may include unintentional dictation errors.   Tommi Rumps, PA-C 08/03/22 1331    Merwyn Katos, MD 08/03/22 1515

## 2022-08-03 NOTE — Discharge Instructions (Signed)
Call make an appointment with Dr. Martha Clan if any continued problems or not improving.  A prescription for prednisone was sent to the pharmacy to take beginning today and tapering down by 1 tablet each day for the next 6 days.  You may apply ice or heat to your shoulder as needed for discomfort.

## 2023-11-01 ENCOUNTER — Ambulatory Visit: Payer: Medicaid Other | Admitting: Podiatry

## 2023-11-01 DIAGNOSIS — L0889 Other specified local infections of the skin and subcutaneous tissue: Secondary | ICD-10-CM

## 2023-11-01 MED ORDER — TERBINAFINE HCL 250 MG PO TABS: 250.0000 mg | ORAL_TABLET | Freq: Every day | ORAL | 0 refills | Status: AC

## 2023-11-01 MED ORDER — DOXYCYCLINE HYCLATE 100 MG PO TABS: 100.0000 mg | ORAL_TABLET | Freq: Two times a day (BID) | ORAL | 0 refills | Status: AC

## 2023-11-01 NOTE — Progress Notes (Signed)
 Subjective:  Patient ID: Blake Kerr, male    DOB: 01/12/1976,  MRN: [id number]  Chief Complaint  Patient presents with   Toe Pain    Place between 5th toe causing discomfort     48 y.o. male presents with the above complaint.  Patient presents with left fifth interdigital space skin infection.  Patient states been present for quite some time is causing him discomfort he has not seen anyone as prior to seeing me would like to discuss treatment options for this pain scale 7 out of 10 dull aching nature he is not taking any antibiotics for   Review of Systems: Negative except as noted in the HPI. Denies N/V/F/Ch.  Past Medical History:  Diagnosis Date   GERD (gastroesophageal reflux disease)     Current Outpatient Medications:    doxycycline (VIBRA-TABS) 100 MG tablet, Take 1 tablet (100 mg total) by mouth 2 (two) times daily., Disp: 20 tablet, Rfl: 0   terbinafine (LAMISIL) 250 MG tablet, Take 1 tablet (250 mg total) by mouth daily., Disp: 90 tablet, Rfl: 0   capsaicin (ZOSTRIX) 0.025 % cream, Apply topically 2 (two) times daily. Apply to upper stomach region, Disp: 60 g, Rfl: 0   cyclobenzaprine (FLEXERIL) 10 MG tablet, Take 10 mg by mouth 2 (two) times daily as needed for muscle spasms., Disp: , Rfl:    dicyclomine (BENTYL) 20 MG tablet, Take 1 tablet (20 mg total) by mouth 3 (three) times daily as needed for spasms., Disp: 20 tablet, Rfl: 0   diphenhydrAMINE (BENADRYL) 25 mg capsule, Take 2 capsules (50 mg total) by mouth every 6 (six) hours as needed., Disp: 60 capsule, Rfl: 0   famotidine (PEPCID) 20 MG tablet, Take 1 tablet (20 mg total) by mouth 2 (two) times daily., Disp: 60 tablet, Rfl: 0   haloperidol (HALDOL) 2 MG tablet, Take 1 tablet (2 mg total) by mouth 2 (two) times daily as needed (for nausea and vomiting not relieved by zofran)., Disp: 20 tablet, Rfl: 0   ketorolac (ACULAR) 0.5 % ophthalmic solution, Place 1 drop into the right eye 4 (four) times daily., Disp: 5  mL, Rfl: 0   omeprazole (PRILOSEC) 40 MG capsule, TAKE 1 CAPSULE BY MOUTH EVERY DAY, Disp: 30 capsule, Rfl: 11   ondansetron (ZOFRAN ODT) 4 MG disintegrating tablet, Allow 1-2 tablets to dissolve in your mouth every 8 hours as needed for nausea/vomiting, Disp: 30 tablet, Rfl: 0   ondansetron (ZOFRAN) 4 MG tablet, Take 1 tablet (4 mg total) by mouth daily as needed for nausea or vomiting., Disp: 20 tablet, Rfl: 1   predniSONE (DELTASONE) 10 MG tablet, Take 6 tablets  today, on day 2 take 5 tablets, day 3 take 4 tablets, day 4 take 3 tablets, day 5 take  2 tablets and 1 tablet the last day, Disp: 21 tablet, Rfl: 0   promethazine (PHENERGAN) 12.5 MG tablet, Take 1 tablet (12.5 mg total) by mouth every 6 (six) hours as needed for nausea or vomiting., Disp: 30 tablet, Rfl: 2   trimethoprim-polymyxin b (POLYTRIM) ophthalmic solution, Place 2 drops into the right eye every 4 (four) hours., Disp: 10 mL, Rfl: 0  Social History   Tobacco Use  Smoking Status Every Day   Current packs/day: 0.50   Types: Cigarettes  Smokeless Tobacco Never    No Known Allergies Objective:  There were no vitals filed for this visit. There is no height or weight on file to calculate BMI. Constitutional Well developed. Well nourished.  Vascular Dorsalis pedis pulses palpable bilaterally. Posterior tibial pulses palpable bilaterally. Capillary refill normal to all digits.  No cyanosis or clubbing noted. Pedal hair growth normal.  Neurologic Normal speech. Oriented to person, place, and time. Epicritic sensation to light touch grossly present bilaterally.  Dermatologic Left fifth interdigital space maceration with superinfection.  No open wounds or lesion noted.  Itching subjectively noted.  Orthopedic: Normal joint ROM without pain or crepitus bilaterally. No visible deformities. No bony tenderness.   Radiographs: None Assessment:  No diagnosis found. Plan:  Patient was evaluated and treated and all questions  answered.  Left fifth interdigital space superinfection/skin infection -All questions and concerns were discussed with the patient in extensive detail -Given the amount of infection is present patient will benefit from Lamisil for 30 days and doxycycline for 14 days I encouraged him to complete the course -I also encouraged Betadine in between the toe to keep it nice and dry he states understanding will do so  No follow-ups on file.

## 2023-12-13 ENCOUNTER — Ambulatory Visit: Payer: Medicaid Other | Admitting: Podiatry

## 2024-08-13 ENCOUNTER — Emergency Department
Admission: EM | Admit: 2024-08-13 | Discharge: 2024-08-13 | Disposition: A | Discharge status: Home / Self Care | Attending: Emergency Medicine | Admitting: Emergency Medicine

## 2024-08-13 ENCOUNTER — Encounter: Payer: Self-pay | Admitting: Emergency Medicine

## 2024-08-13 ENCOUNTER — Other Ambulatory Visit: Payer: Self-pay

## 2024-08-13 DIAGNOSIS — R1084 Generalized abdominal pain: Secondary | ICD-10-CM | POA: Insufficient documentation

## 2024-08-13 DIAGNOSIS — R112 Nausea with vomiting, unspecified: Secondary | ICD-10-CM

## 2024-08-13 DIAGNOSIS — D72829 Elevated white blood cell count, unspecified: Secondary | ICD-10-CM | POA: Diagnosis not present

## 2024-08-13 DIAGNOSIS — R1115 Cyclical vomiting syndrome unrelated to migraine: Secondary | ICD-10-CM | POA: Insufficient documentation

## 2024-08-13 LAB — COMPREHENSIVE METABOLIC PANEL WITH GFR
ALT: 21 U/L (ref 0–44)
AST: 18 U/L (ref 15–41)
Albumin: 5.1 g/dL — ABNORMAL HIGH (ref 3.5–5.0)
Alkaline Phosphatase: 77 U/L (ref 38–126)
Anion gap: 15 (ref 5–15)
BUN: 26 mg/dL — ABNORMAL HIGH (ref 6–20)
CO2: 27 mmol/L (ref 22–32)
Calcium: 10 mg/dL (ref 8.9–10.3)
Chloride: 96 mmol/L — ABNORMAL LOW (ref 98–111)
Creatinine, Ser: 0.86 mg/dL (ref 0.61–1.24)
GFR, Estimated: >60 mL/min (ref >60)
Glucose, Bld: 129 mg/dL — ABNORMAL HIGH (ref 70–99)
Potassium: 4 mmol/L (ref 3.5–5.1)
Sodium: 137 mmol/L (ref 135–145)
Total Bilirubin: 1.1 mg/dL (ref 0.0–1.2)
Total Protein: 8 g/dL (ref 6.5–8.1)

## 2024-08-13 LAB — CBC WITH DIFFERENTIAL/PLATELET
Abs Immature Granulocytes: 0.06 K/uL (ref 0.00–0.07)
Basophils Absolute: 0 K/uL (ref 0.0–0.1)
Basophils Relative: 0 %
Eosinophils Absolute: 0 K/uL (ref 0.0–0.5)
Eosinophils Relative: 0 %
HCT: 47 % (ref 39.0–52.0)
Hemoglobin: 16.8 g/dL (ref 13.0–17.0)
Immature Granulocytes: 0 %
Lymphocytes Relative: 20 %
Lymphs Abs: 2.7 K/uL (ref 0.7–4.0)
MCH: 32.1 pg (ref 26.0–34.0)
MCHC: 35.7 g/dL (ref 30.0–36.0)
MCV: 89.7 fL (ref 80.0–100.0)
Monocytes Absolute: 1.1 K/uL — ABNORMAL HIGH (ref 0.1–1.0)
Monocytes Relative: 8 %
Neutro Abs: 9.7 K/uL — ABNORMAL HIGH (ref 1.7–7.7)
Neutrophils Relative %: 72 %
Platelets: 293 K/uL (ref 150–400)
RBC: 5.24 MIL/uL (ref 4.22–5.81)
RDW: 12.2 % (ref 11.5–15.5)
WBC: 13.5 K/uL — ABNORMAL HIGH (ref 4.0–10.5)
nRBC: 0 % (ref 0.0–0.2)

## 2024-08-13 LAB — LIPASE, BLOOD: Lipase: 15 U/L (ref 11–51)

## 2024-08-13 LAB — ETHANOL: Alcohol, Ethyl (B): <15 mg/dL (ref <15)

## 2024-08-13 MED ORDER — DIPHENHYDRAMINE HCL 50 MG/ML IJ SOLN
25.0000 mg | Freq: Once | INTRAMUSCULAR | Status: AC
  Administered 2024-08-13: 25 mg via INTRAVENOUS
  Filled 2024-08-13: qty 1

## 2024-08-13 MED ORDER — ONDANSETRON HCL 4 MG/2ML IJ SOLN
4.0000 mg | Freq: Once | INTRAMUSCULAR | Status: AC
  Administered 2024-08-13: 4 mg via INTRAVENOUS
  Filled 2024-08-13: qty 2

## 2024-08-13 MED ORDER — DROPERIDOL 2.5 MG/ML IJ SOLN
2.5000 mg | Freq: Once | INTRAMUSCULAR | Status: AC | PRN
  Administered 2024-08-13: 2.5 mg via INTRAVENOUS
  Filled 2024-08-13: qty 2

## 2024-08-13 MED ORDER — METOCLOPRAMIDE HCL 10 MG PO TABS: 10.0000 mg | ORAL_TABLET | Freq: Three times a day (TID) | ORAL | 1 refills | Status: AC

## 2024-08-13 MED ORDER — LACTATED RINGERS IV BOLUS
1000.0000 mL | Freq: Once | INTRAVENOUS | Status: AC
  Administered 2024-08-13: 1000 mL via INTRAVENOUS

## 2024-08-13 MED ORDER — ONDANSETRON 8 MG PO TBDP: 8.0000 mg | ORAL_TABLET | Freq: Three times a day (TID) | ORAL | 0 refills | Status: AC | PRN

## 2024-08-13 NOTE — ED Provider Notes (Signed)
 [facility] Provider Note   Event Date/Time   First MD Initiated Contact with Patient 08/13/24 [phone]     (approximate) History  Abdominal Pain  HPI Blake Kerr is a 48 y.o. male with a past medical history of cyclic vomiting syndrome who presents complaining of generalized abdominal pain that is worse in the epigastric region with associated nausea/vomiting.  Patient states that this is similar to previous cyclic vomiting episodes that he has had in the past.  Patient denies any medication that he takes daily for the symptoms.  Patient denies any bloody vomit ROS: Patient currently denies any vision changes, tinnitus, difficulty speaking, facial droop, sore throat, chest pain, shortness of breath, diarrhea, dysuria, or weakness/numbness/paresthesias in any extremity   Physical Exam  Triage Vital Signs: ED Triage Vitals  Encounter Vitals Group     BP 08/13/24 0338 (!) 149/106     Girls Systolic BP Percentile --      Girls Diastolic BP Percentile --      Boys Systolic BP Percentile --      Boys Diastolic BP Percentile --      Pulse Rate 08/13/24 0338 83     Resp 08/13/24 0338 (!) 22     Temp 08/13/24 0338 98.3 F (36.8 C)     Temp Source 08/13/24 0338 Oral     SpO2 08/13/24 0338 100 %     Weight 08/13/24 0336 170 lb (77.1 kg)     Height 08/13/24 0336 6' 1 (1.854 m)     Head Circumference --      Peak Flow --      Pain Score 08/13/24 0336 10     Pain Loc --      Pain Education --      Exclude from Growth Chart --    Most recent vital signs: Vitals:   08/13/24 0338  BP: (!) 149/106  Pulse: 83  Resp: (!) 22  Temp: 98.3 F (36.8 C)  SpO2: 100%   General: Awake, oriented x4. CV:  Good peripheral perfusion. Resp:  Normal effort. Abd:  No distention. Other:  Middle-aged well-developed, well-nourished Caucasian male resting comfortably in no acute distress ED Results / Procedures / Treatments  Labs (all labs ordered are listed, but only  abnormal results are displayed) Labs Reviewed  COMPREHENSIVE METABOLIC PANEL WITH GFR - Abnormal; Notable for the following components:      Result Value   Chloride 96 (*)    Glucose, Bld 129 (*)    BUN 26 (*)    Albumin 5.1 (*)    All other components within normal limits  CBC WITH DIFFERENTIAL/PLATELET - Abnormal; Notable for the following components:   WBC 13.5 (*)    Neutro Abs 9.7 (*)    Monocytes Absolute 1.1 (*)    All other components within normal limits  LIPASE, BLOOD  ETHANOL    PROCEDURES: Critical Care performed: No Procedures MEDICATIONS ORDERED IN ED: Medications  ondansetron  (ZOFRAN ) injection 4 mg (4 mg Intravenous Given 08/13/24 0349)  lactated ringers  bolus 1,000 mL (0 mLs Intravenous Stopped 08/13/24 0551)  droperidol  (INAPSINE ) 2.5 MG/ML injection 2.5 mg (2.5 mg Intravenous Given 08/13/24 0404)  diphenhydrAMINE  (BENADRYL ) injection 25 mg (25 mg Intravenous Given 08/13/24 0547)   IMPRESSION / MDM / ASSESSMENT AND PLAN / ED COURSE  I reviewed the triage vital signs and the nursing notes.  The patient is on the cardiac monitor to evaluate for evidence of arrhythmia and/or significant heart rate changes. Patient's presentation is most consistent with acute presentation with potential threat to life or bodily function. 48 year old male with the above-stated past medical history who presents complaining of generalized abdominal pain worse in the epigastric region with associated nausea/vomiting similar to previous episodes of cyclic vomiting syndrome DDx: Cyclic vomiting syndrome, gastroenteritis, small bowel obstruction, biliary disease Plan: CBC, CMP, lipase, ethanol  Laboratory evaluation shows mild leukocytosis of 13.5.  Patient is p.o. tolerant after Zofran , droperidol , and Benadryl .  Patient stable for discharge at this time with outpatient follow-up as needed  Dispo: Discharge home with PCP follow-up   FINAL CLINICAL IMPRESSION(S)  / ED DIAGNOSES   Final diagnoses:  Generalized abdominal pain  Nausea and vomiting, unspecified vomiting type  Cyclic vomiting syndrome   Rx / DC Orders   ED Discharge Orders          Ordered    ondansetron  (ZOFRAN -ODT) 8 MG disintegrating tablet  Every 8 hours PRN        08/13/24 0554    metoCLOPramide  (REGLAN ) 10 MG tablet  3 times daily with meals        08/13/24 0554           Note:  This document was prepared using Dragon voice recognition software and may include unintentional dictation errors.   Jane Broughton K, MD 08/13/24 [phone]

## 2024-08-13 NOTE — ED Notes (Signed)
 Pt rolling around on bed reporting his lower abdomen is hurting. Pt constantly moaning.

## 2024-08-13 NOTE — ED Notes (Signed)
 After administration of IV benadryl , pt reports he would like to leave AMA. Pt educated on risk of leaving and encouraged to stay. Pt refused and walked out of ED.

## 2024-08-13 NOTE — ED Triage Notes (Signed)
 Pt to ED via POV with c/p severe abd cramping since Thursday night. Pt endorses vomiting, pt's SO reports has been attempting gatorade but has been unable to keep it down. Pt presents clearly uncomfortable, constant moaning on arrival.   Pt taken to room 15 for triage.
